# Patient Record
Sex: Female | Born: 1950
Health system: Southern US, Community
[De-identification: ages and names within clinical notes are randomized; demographics above are authoritative.]

## PROBLEM LIST (undated history)

## (undated) DIAGNOSIS — E785 Hyperlipidemia, unspecified: Secondary | ICD-10-CM

## (undated) DIAGNOSIS — I10 Essential (primary) hypertension: Secondary | ICD-10-CM

## (undated) HISTORY — DX: Hyperlipidemia, unspecified: E78.5

## (undated) HISTORY — DX: Essential (primary) hypertension: I10

---

## 2015-07-05 LAB — COLOGUARD: COLOGUARD: NEGATIVE

## 2016-05-31 LAB — LIPID PANEL
CHOLESTEROL: 175 (ref 0–200)
HDL: 84 — AB (ref 35–70)
LDL CALC: 82
TRIGLYCERIDES: 43 (ref 40–160)

## 2016-05-31 LAB — HEPATIC FUNCTION PANEL
ALT: 16 (ref 7–35)
ALT: 16 (ref 7–35)
AST: 17 (ref 13–35)
AST: 17 (ref 13–35)
Alkaline Phosphatase: 61 (ref 25–125)
Alkaline Phosphatase: 61 (ref 25–125)
BILIRUBIN, TOTAL: 0.3
Bilirubin, Total: 0.3

## 2016-05-31 LAB — CBC AND DIFFERENTIAL
HEMATOCRIT: 40 (ref 36–46)
HEMATOCRIT: 40 (ref 36–46)
HEMOGLOBIN: 13.3 (ref 12.0–16.0)
Hemoglobin: 13.8 (ref 12.0–16.0)
PLATELETS: 414 — AB (ref 150–399)
PLATELETS: 414 — AB (ref 150–399)
WBC: 8.9
WBC: 8.9

## 2016-05-31 LAB — BASIC METABOLIC PANEL
BUN: 15 (ref 4–21)
BUN: 15 (ref 4–21)
Creatinine: 0.8 (ref 0.5–1.1)
Creatinine: 0.8 (ref 0.5–1.1)
GLUCOSE: 103
Glucose: 103
Potassium: 3.9 (ref 3.4–5.3)
Potassium: 3.9 (ref 3.4–5.3)
Sodium: 141 (ref 137–147)
Sodium: 141 (ref 137–147)

## 2016-12-14 ENCOUNTER — Ambulatory Visit: Payer: Self-pay | Admitting: Sports Medicine

## 2016-12-15 ENCOUNTER — Ambulatory Visit (INDEPENDENT_AMBULATORY_CARE_PROVIDER_SITE_OTHER): Payer: Self-pay | Admitting: Sports Medicine

## 2016-12-15 ENCOUNTER — Encounter: Payer: Self-pay | Admitting: Sports Medicine

## 2016-12-15 VITALS — BP 120/73 | HR 71 | Ht 59.0 in | Wt 110.0 lb

## 2016-12-15 DIAGNOSIS — M79675 Pain in left toe(s): Secondary | ICD-10-CM

## 2016-12-15 DIAGNOSIS — B070 Plantar wart: Secondary | ICD-10-CM

## 2016-12-15 NOTE — Progress Notes (Signed)
  Subjective: Wendy Wheeler is a 66 y.o. female patient who presents to office for evaluation of Left foot pain secondary to painful wart at the great toe. Patient has noticed this since 6 months ago. Reports history of using gym showers without shower shoes. Patient denies any other pedal complaints.   There are no active problems to display for this patient.   No current outpatient prescriptions on file prior to visit.   No current facility-administered medications on file prior to visit.     Allergies  Allergen Reactions  . Aleve [Naproxen Sodium] Swelling    Objective:  General: Alert and oriented x3 in no acute distress  Dermatology: Keratotic lesion 2 present measuring less than 1 cm at plantar hallux on left with no skin lines transversing the lesion, pain is present with medial lateral pressure to the lesion, capillaries with pin point bleeding noted, no webspace macerations, no ecchymosis bilateral, all nails x 10 are well manicured.  Vascular: Dorsalis Pedis and Posterior Tibial pedal pulses 2/4, Capillary Fill Time 3 seconds, + pedal hair growth bilateral, no edema bilateral lower extremities, Temperature gradient within normal limits.  Neurology: Gross sensation intact via light touch bilateral.  Musculoskeletal: Mild tenderness with palpation at the lesion site on Left, Muscular strength 5/5 in all groups without pain or limitation on range of motion. No lower extremity muscular or boney deformity noted.  Assessment and Plan: Problem List Items Addressed This Visit    None    Visit Diagnoses    Plantar wart of left foot    -  Primary   Great toe pain, left         -Complete examination performed -Discussed treatment options for wart -Parred keratoic warty lesions 2 using a chisel blade; treated the areas with Catharidin covered with bandaid; Advised patient of blistering reaction that will occur from application of medication and once this happens replace bandaid  with neosporin and tape/bandaid -Patient to return to office in 3 weeks or sooner if condition worsens.  Landis Martins, DPM

## 2017-01-17 ENCOUNTER — Ambulatory Visit (INDEPENDENT_AMBULATORY_CARE_PROVIDER_SITE_OTHER): Payer: Self-pay | Admitting: Family Medicine

## 2017-01-17 ENCOUNTER — Encounter: Payer: Self-pay | Admitting: Family Medicine

## 2017-01-17 VITALS — BP 161/98 | HR 87 | Ht 59.0 in | Wt 110.8 lb

## 2017-01-17 DIAGNOSIS — Z87891 Personal history of nicotine dependence: Secondary | ICD-10-CM

## 2017-01-17 DIAGNOSIS — I10 Essential (primary) hypertension: Secondary | ICD-10-CM

## 2017-01-17 DIAGNOSIS — E784 Other hyperlipidemia: Secondary | ICD-10-CM

## 2017-01-17 DIAGNOSIS — E7849 Other hyperlipidemia: Secondary | ICD-10-CM

## 2017-01-17 DIAGNOSIS — E782 Mixed hyperlipidemia: Secondary | ICD-10-CM | POA: Insufficient documentation

## 2017-01-17 DIAGNOSIS — G8929 Other chronic pain: Secondary | ICD-10-CM

## 2017-01-17 DIAGNOSIS — M545 Low back pain: Secondary | ICD-10-CM

## 2017-01-17 DIAGNOSIS — Z62810 Personal history of physical and sexual abuse in childhood: Secondary | ICD-10-CM

## 2017-01-17 NOTE — Patient Instructions (Signed)
In your please bring in your recent lab results that were done in [place] at next office visit.  Please also bring in a log of your blood pressures at home so that we can determine if it's white coat syndrome for sure on top of hypertension or not.  Please come fasting next office visit so we can obtain additional labs as needed.  Please realize, EXERCISE IS MEDICINE!  -  American Heart Association [facility]) guidelines for exercise : If you are in good health, without any medical conditions, you should engage in 150 minutes of moderate intensity aerobic activity per week.  This means you should be huffing and puffing throughout your workout.   Engaging in regular exercise will improve brain function and memory, as well as improve mood, boost immune system and help with weight management.  As well as the other, more well-known effects of exercise such as decreasing blood sugar levels, decreasing blood pressure,  and decreasing bad cholesterol levels/ increasing good cholesterol levels.     -  The AHA strongly endorses consumption of a diet that contains a variety of foods from all the food categories with an emphasis on fruits and vegetables; fat-free and low-fat dairy products; cereal and grain products; legumes and nuts; and fish, poultry, and/or extra lean meats.    Excessive food intake, especially of foods high in saturated and trans fats, sugar, and salt, should be avoided.    Adequate water intake of roughly 1/2 of your weight in pounds, should equal the ounces of water per day you should drink.  So for instance, if you're 200 pounds, that would be 100 ounces of water per day.         Mediterranean Diet  Why follow it? Research shows. . Those who follow the Mediterranean diet have a reduced risk of heart disease  . The diet is associated with a reduced incidence of Parkinson's and Alzheimer's diseases . People following the diet may have longer life expectancies and lower rates of chronic  diseases  . The Dietary Guidelines for Americans recommends the Mediterranean diet as an eating plan to promote health and prevent disease  What Is the Mediterranean Diet?  . Healthy eating plan based on typical foods and recipes of Mediterranean-style cooking . The diet is primarily a plant based diet; these foods should make up a majority of meals   Starches - Plant based foods should make up a majority of meals - They are an important sources of vitamins, minerals, energy, antioxidants, and fiber - Choose whole grains, foods high in fiber and minimally processed items  - Typical grain sources include wheat, oats, barley, corn, brown rice, bulgar, farro, millet, polenta, couscous  - Various types of beans include chickpeas, lentils, fava beans, black beans, white beans   Fruits  Veggies - Large quantities of antioxidant rich fruits & veggies; 6 or more servings  - Vegetables can be eaten raw or lightly drizzled with oil and cooked  - Vegetables common to the traditional Mediterranean Diet include: artichokes, arugula, beets, broccoli, brussel sprouts, cabbage, carrots, celery, collard greens, cucumbers, eggplant, kale, leeks, lemons, lettuce, mushrooms, okra, onions, peas, peppers, potatoes, pumpkin, radishes, rutabaga, shallots, spinach, sweet potatoes, turnips, zucchini - Fruits common to the Mediterranean Diet include: apples, apricots, avocados, cherries, clementines, dates, figs, grapefruits, grapes, melons, nectarines, oranges, peaches, pears, pomegranates, strawberries, tangerines  Fats - Replace butter and margarine with healthy oils, such as olive oil, canola oil, and tahini  - Limit nuts to  no more than a handful a day  - Nuts include walnuts, almonds, pecans, pistachios, pine nuts  - Limit or avoid candied, honey roasted or heavily salted nuts - Olives are central to the Mediterranean diet - can be eaten whole or used in a variety of dishes   Meats Protein - Limiting red meat: no  more than a few times a month - When eating red meat: choose lean cuts and keep the portion to the size of deck of cards - Eggs: approx. 0 to 4 times a week  - Fish and lean poultry: at least 2 a week  - Healthy protein sources include, chicken, [place], lean beef, lamb - Increase intake of seafood such as tuna, salmon, trout, mackerel, shrimp, scallops - Avoid or limit high fat processed meats such as sausage and bacon  Dairy - Include moderate amounts of low fat dairy products  - Focus on healthy dairy such as fat free yogurt, skim milk, low or reduced fat cheese - Limit dairy products higher in fat such as whole or 2% milk, cheese, ice cream  Alcohol - Moderate amounts of red wine is ok  - No more than 5 oz daily for women (all ages) and men older than age 63  - No more than 10 oz of wine daily for men younger than 78  Other - Limit sweets and other desserts  - Use herbs and spices instead of salt to flavor foods  - Herbs and spices common to the traditional Mediterranean Diet include: basil, bay leaves, chives, cloves, cumin, fennel, garlic, lavender, marjoram, mint, oregano, parsley, pepper, rosemary, sage, savory, sumac, tarragon, thyme   It's not just a diet, it's a lifestyle:  . The Mediterranean diet includes lifestyle factors typical of those in the region  . Foods, drinks and meals are best eaten with others and savored . Daily physical activity is important for overall good health . This could be strenuous exercise like running and aerobics . This could also be more leisurely activities such as walking, housework, yard-work, or taking the stairs . Moderation is the key; a balanced and healthy diet accommodates most foods and drinks . Consider portion sizes and frequency of consumption of certain foods   Meal Ideas & Options:  . Breakfast:  o Whole wheat toast or whole wheat English muffins with peanut butter & hard boiled egg o Steel cut oats topped with apples & cinnamon and  skim milk  o Fresh fruit: banana, strawberries, melon, berries, peaches  o Smoothies: strawberries, bananas, greek yogurt, peanut butter o Low fat greek yogurt with blueberries and granola  o Egg white omelet with spinach and mushrooms o Breakfast couscous: whole wheat couscous, apricots, skim milk, cranberries  . Sandwiches:  o Hummus and grilled vegetables (peppers, zucchini, squash) on whole wheat bread   o Grilled chicken on whole wheat pita with lettuce, tomatoes, cucumbers or tzatziki  o Tuna salad on whole wheat bread: tuna salad made with greek yogurt, olives, red peppers, capers, green onions o Garlic rosemary lamb pita: lamb sauted with garlic, rosemary, salt & pepper; add lettuce, cucumber, greek yogurt to pita - flavor with lemon juice and black pepper  . Seafood:  o Mediterranean grilled salmon, seasoned with garlic, basil, parsley, lemon juice and black pepper o Shrimp, lemon, and spinach whole-grain pasta salad made with low fat greek yogurt  o Seared scallops with lemon orzo  o Seared tuna steaks seasoned salt, pepper, coriander topped with tomato mixture of olives,  tomatoes, olive oil, minced garlic, parsley, green onions and cappers  . Meats:  o Herbed greek chicken salad with kalamata olives, cucumber, feta  o Red bell peppers stuffed with spinach, bulgur, lean ground beef (or lentils) & topped with feta   o Kebabs: skewers of chicken, tomatoes, onions, zucchini, squash  o [place] burgers: made with red onions, mint, dill, lemon juice, feta cheese topped with roasted red peppers . Vegetarian o Cucumber salad: cucumbers, artichoke hearts, celery, red onion, feta cheese, tossed in olive oil & lemon juice  o Hummus and whole grain pita points with a greek salad (lettuce, tomato, feta, olives, cucumbers, red onion) o Lentil soup with celery, carrots made with vegetable broth, garlic, salt and pepper  o Tabouli salad: parsley, bulgur, mint, scallions, cucumbers, tomato,  radishes, lemon juice, olive oil, salt and pepper.
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
## 2017-01-19 ENCOUNTER — Ambulatory Visit: Payer: Self-pay | Admitting: Sports Medicine

## 2017-02-23 ENCOUNTER — Ambulatory Visit (INDEPENDENT_AMBULATORY_CARE_PROVIDER_SITE_OTHER): Payer: Medicare HMO | Admitting: Family Medicine

## 2017-02-23 VITALS — BP 160/96 | HR 76 | Ht 59.0 in | Wt 111.4 lb

## 2017-02-23 DIAGNOSIS — E782 Mixed hyperlipidemia: Secondary | ICD-10-CM

## 2017-02-23 DIAGNOSIS — R5383 Other fatigue: Secondary | ICD-10-CM

## 2017-02-23 DIAGNOSIS — Z87891 Personal history of nicotine dependence: Secondary | ICD-10-CM

## 2017-02-23 DIAGNOSIS — I10 Essential (primary) hypertension: Secondary | ICD-10-CM

## 2017-02-23 MED ORDER — ATORVASTATIN CALCIUM 20 MG PO TABS: 20.0000 mg | ORAL_TABLET | Freq: Every day | ORAL | 0 refills | Status: DC

## 2017-02-23 MED ORDER — HYDROCHLOROTHIAZIDE 25 MG PO TABS: 25.0000 mg | ORAL_TABLET | Freq: Every day | ORAL | 0 refills | Status: DC
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
## 2017-02-23 NOTE — Patient Instructions (Addendum)
-   per patient she says she will come in the very start of next week to get fasting blood work since she is not fasting today.  -  Please come in in about 3-4 weeks so we can review your labs and also again review your home blood pressure log.  Try to check your blood pressure every other day if possible at random times.  Write it down and bring in that when a piece of paper with you next office visit.  -Patient prefers to come in and talk to me about the labs in the near future.  We will then schedule her in the far off future for a complete physical which will include all her health maintenance, immunizations etc.      Also reminded patient it would be important for her to come in for Medicare wellness exam as well as a complete physical as these are 2 separate things and we discussed it today.

## 2017-02-27 ENCOUNTER — Other Ambulatory Visit (INDEPENDENT_AMBULATORY_CARE_PROVIDER_SITE_OTHER): Payer: Medicare HMO

## 2017-02-27 DIAGNOSIS — E782 Mixed hyperlipidemia: Secondary | ICD-10-CM

## 2017-02-27 DIAGNOSIS — Z87891 Personal history of nicotine dependence: Secondary | ICD-10-CM

## 2017-02-27 DIAGNOSIS — R5383 Other fatigue: Secondary | ICD-10-CM

## 2017-02-27 DIAGNOSIS — I10 Essential (primary) hypertension: Secondary | ICD-10-CM

## 2017-02-27 DIAGNOSIS — E559 Vitamin D deficiency, unspecified: Secondary | ICD-10-CM | POA: Diagnosis not present

## 2017-02-28 LAB — VITAMIN D 25 HYDROXY (VIT D DEFICIENCY, FRACTURES): VIT D 25 HYDROXY: 47.5 ng/mL (ref 30.0–100.0)

## 2017-02-28 LAB — COMPREHENSIVE METABOLIC PANEL
ALT: 12 IU/L (ref 0–32)
AST: 17 IU/L (ref 0–40)
Albumin/Globulin Ratio: 1.7 (ref 1.2–2.2)
Albumin: 4.5 g/dL (ref 3.6–4.8)
Alkaline Phosphatase: 74 IU/L (ref 39–117)
BUN/Creatinine Ratio: 18 (ref 12–28)
BUN: 12 mg/dL (ref 8–27)
Bilirubin Total: 0.5 mg/dL (ref 0.0–1.2)
CALCIUM: 10 mg/dL (ref 8.7–10.3)
CO2: 29 mmol/L (ref 20–29)
CREATININE: 0.67 mg/dL (ref 0.57–1.00)
Chloride: 96 mmol/L (ref 96–106)
GFR, EST AFRICAN AMERICAN: 107 mL/min/{1.73_m2} (ref >59)
GFR, EST NON AFRICAN AMERICAN: 93 mL/min/{1.73_m2} (ref >59)
GLUCOSE: 90 mg/dL (ref 65–99)
Globulin, Total: 2.6 g/dL (ref 1.5–4.5)
Potassium: 4 mmol/L (ref 3.5–5.2)
Sodium: 141 mmol/L (ref 134–144)
TOTAL PROTEIN: 7.1 g/dL (ref 6.0–8.5)

## 2017-02-28 LAB — HEPATITIS C ANTIBODY: HEP C VIRUS AB: 0.1 {s_co_ratio} (ref 0.0–0.9)

## 2017-02-28 LAB — CBC WITH DIFFERENTIAL/PLATELET
BASOS ABS: 0 10*3/uL (ref 0.0–0.2)
BASOS: 0 %
EOS (ABSOLUTE): 0.3 10*3/uL (ref 0.0–0.4)
Eos: 3 %
HEMOGLOBIN: 13.4 g/dL (ref 11.1–15.9)
Hematocrit: 38.9 % (ref 34.0–46.6)
IMMATURE GRANS (ABS): 0 10*3/uL (ref 0.0–0.1)
Immature Granulocytes: 0 %
LYMPHS: 35 %
Lymphocytes Absolute: 3.4 10*3/uL — ABNORMAL HIGH (ref 0.7–3.1)
MCH: 31 pg (ref 26.6–33.0)
MCHC: 34.4 g/dL (ref 31.5–35.7)
MCV: 90 fL (ref 79–97)
MONOCYTES: 10 %
Monocytes Absolute: 0.9 10*3/uL (ref 0.1–0.9)
NEUTROS PCT: 52 %
Neutrophils Absolute: 4.9 10*3/uL (ref 1.4–7.0)
PLATELETS: 441 10*3/uL — AB (ref 150–379)
RBC: 4.32 x10E6/uL (ref 3.77–5.28)
RDW: 13.1 % (ref 12.3–15.4)
WBC: 9.5 10*3/uL (ref 3.4–10.8)

## 2017-02-28 LAB — LIPID PANEL
CHOLESTEROL TOTAL: 211 mg/dL — AB (ref 100–199)
Chol/HDL Ratio: 2.3 ratio (ref 0.0–4.4)
HDL: 91 mg/dL (ref >39)
LDL Calculated: 112 mg/dL — ABNORMAL HIGH (ref 0–99)
Triglycerides: 39 mg/dL (ref 0–149)
VLDL Cholesterol Cal: 8 mg/dL (ref 5–40)

## 2017-02-28 LAB — HEMOGLOBIN A1C
Est. average glucose Bld gHb Est-mCnc: 114 mg/dL
Hgb A1c MFr Bld: 5.6 % (ref 4.8–5.6)

## 2017-02-28 LAB — T4, FREE: FREE T4: 1.26 ng/dL (ref 0.82–1.77)

## 2017-02-28 LAB — TSH: TSH: 2.22 u[IU]/mL (ref 0.450–4.500)

## 2017-02-28 LAB — HIV ANTIBODY (ROUTINE TESTING W REFLEX): HIV SCREEN 4TH GENERATION: NONREACTIVE

## 2017-03-20 ENCOUNTER — Ambulatory Visit (INDEPENDENT_AMBULATORY_CARE_PROVIDER_SITE_OTHER): Payer: Medicare HMO | Admitting: Family Medicine

## 2017-03-20 ENCOUNTER — Encounter: Payer: Self-pay | Admitting: Family Medicine

## 2017-03-20 ENCOUNTER — Other Ambulatory Visit: Payer: Medicare HMO

## 2017-03-20 VITALS — BP 154/70 | HR 66 | Ht 59.0 in | Wt 113.3 lb

## 2017-03-20 DIAGNOSIS — E7849 Other hyperlipidemia: Secondary | ICD-10-CM

## 2017-03-20 DIAGNOSIS — Z79899 Other long term (current) drug therapy: Secondary | ICD-10-CM

## 2017-03-20 DIAGNOSIS — Z87891 Personal history of nicotine dependence: Secondary | ICD-10-CM

## 2017-03-20 DIAGNOSIS — E7889 Other lipoprotein metabolism disorders: Secondary | ICD-10-CM | POA: Diagnosis not present

## 2017-03-20 DIAGNOSIS — I10 Essential (primary) hypertension: Secondary | ICD-10-CM | POA: Diagnosis not present

## 2017-03-20 DIAGNOSIS — Z23 Encounter for immunization: Secondary | ICD-10-CM

## 2017-03-20 DIAGNOSIS — E782 Mixed hyperlipidemia: Secondary | ICD-10-CM | POA: Diagnosis not present
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
## 2017-03-20 NOTE — Patient Instructions (Addendum)
In 3 months we'll bring you back and recheck fasting lipid profile as well as ALT since she just restarted your cholesterol medicines back about 4 weeks ago.   Then office with visit with me 1 week later to discuss them per year desire.  Low saturated Transfats diet to help with LDL levels.  Exercise to a cheek guidelines discussed with patient again.  - Maintenance of weight but just eating and being healthier is patient's goal.  - Continue to monitor blood pressure at home as it is well controlled and patient has white coat syndrome on top of hypertension which is been established for many, many years.

## 2017-03-21 ENCOUNTER — Telehealth: Payer: Self-pay | Admitting: Family Medicine

## 2017-03-21 NOTE — Telephone Encounter (Signed)
Spoke with patient and states that she if feeling better today.  Advised pt that the flu shot does not cause the flu as it is an inactivated virus.  Advised pt that if she does not continue to improve, please call our office back.  Pt expressed understanding and is agreeable.  Charyl Bigger, CMA

## 2017-03-21 NOTE — Addendum Note (Signed)
Addended by: Lanier Prude D on: 03/21/2017 05:18 PM   Modules accepted: Orders

## 2017-03-21 NOTE — Telephone Encounter (Signed)
Patient called wanting to speak with someone clinical about her flu shot from yesterday. She states that a few hours after she had pain in her  right arm at the injection site. This was followed by fatigue, puking, diarrhea, and flu symptoms (chills and fever). She states she rested the entire evening and took Tylenol for the symptoms. She is currently feeling slightly better but still would like to talk to the clinic staff.

## 2017-03-28 ENCOUNTER — Other Ambulatory Visit: Payer: Self-pay | Admitting: Family Medicine

## 2017-03-28 DIAGNOSIS — E782 Mixed hyperlipidemia: Secondary | ICD-10-CM

## 2017-03-28 DIAGNOSIS — I10 Essential (primary) hypertension: Secondary | ICD-10-CM

## 2017-04-04 ENCOUNTER — Telehealth: Payer: Self-pay | Admitting: Family Medicine

## 2017-04-04 NOTE — Telephone Encounter (Signed)
We already received from [facility] and medication refills have been sent. MPulliam, CMA/RT(R)

## 2017-04-04 NOTE — Telephone Encounter (Signed)
Pt called states [facility] will be sending [place] Rx refill request for two of her medications :  Atorvastin /Lipitor 20 MG tablets  & Hydrochlorothiazide/Hydrodiuril 25 MG tablets.  ------She is out of both ---- --glh

## 2017-04-05 ENCOUNTER — Other Ambulatory Visit: Payer: Self-pay

## 2017-04-05 DIAGNOSIS — E782 Mixed hyperlipidemia: Secondary | ICD-10-CM

## 2017-04-05 DIAGNOSIS — I10 Essential (primary) hypertension: Secondary | ICD-10-CM

## 2017-04-05 MED ORDER — HYDROCHLOROTHIAZIDE 25 MG PO TABS: 25.0000 mg | ORAL_TABLET | Freq: Every day | ORAL | 0 refills | Status: DC

## 2017-04-05 MED ORDER — ATORVASTATIN CALCIUM 20 MG PO TABS: 20.0000 mg | ORAL_TABLET | Freq: Every day | ORAL | 0 refills | Status: DC

## 2017-06-13 ENCOUNTER — Other Ambulatory Visit: Payer: Self-pay | Admitting: Family Medicine

## 2017-06-13 DIAGNOSIS — I10 Essential (primary) hypertension: Secondary | ICD-10-CM

## 2017-06-13 DIAGNOSIS — E782 Mixed hyperlipidemia: Secondary | ICD-10-CM

## 2017-06-25 ENCOUNTER — Other Ambulatory Visit (INDEPENDENT_AMBULATORY_CARE_PROVIDER_SITE_OTHER): Payer: Medicare HMO

## 2017-06-25 DIAGNOSIS — E782 Mixed hyperlipidemia: Secondary | ICD-10-CM | POA: Diagnosis not present

## 2017-06-25 DIAGNOSIS — Z79899 Other long term (current) drug therapy: Secondary | ICD-10-CM

## 2017-06-26 LAB — LIPID PANEL
CHOL/HDL RATIO: 2 ratio (ref 0.0–4.4)
Cholesterol, Total: 178 mg/dL (ref 100–199)
HDL: 89 mg/dL (ref >39)
LDL CALC: 81 mg/dL (ref 0–99)
TRIGLYCERIDES: 42 mg/dL (ref 0–149)
VLDL CHOLESTEROL CAL: 8 mg/dL (ref 5–40)

## 2017-06-26 LAB — ALT: ALT: 17 IU/L (ref 0–32)

## 2017-07-02 ENCOUNTER — Encounter: Payer: Self-pay | Admitting: Family Medicine

## 2017-07-02 ENCOUNTER — Ambulatory Visit (INDEPENDENT_AMBULATORY_CARE_PROVIDER_SITE_OTHER): Payer: Medicare HMO | Admitting: Family Medicine

## 2017-07-02 VITALS — BP 136/90 | HR 89 | Ht 59.0 in | Wt 116.7 lb

## 2017-07-02 DIAGNOSIS — Z1231 Encounter for screening mammogram for malignant neoplasm of breast: Secondary | ICD-10-CM | POA: Diagnosis not present

## 2017-07-02 DIAGNOSIS — E782 Mixed hyperlipidemia: Secondary | ICD-10-CM | POA: Diagnosis not present

## 2017-07-02 DIAGNOSIS — Z803 Family history of malignant neoplasm of breast: Secondary | ICD-10-CM | POA: Insufficient documentation

## 2017-07-02 DIAGNOSIS — Z1239 Encounter for other screening for malignant neoplasm of breast: Secondary | ICD-10-CM

## 2017-07-02 DIAGNOSIS — Z833 Family history of diabetes mellitus: Secondary | ICD-10-CM | POA: Diagnosis not present

## 2017-07-02 DIAGNOSIS — Z87891 Personal history of nicotine dependence: Secondary | ICD-10-CM | POA: Diagnosis not present

## 2017-07-02 DIAGNOSIS — E7889 Other lipoprotein metabolism disorders: Secondary | ICD-10-CM | POA: Diagnosis not present

## 2017-07-02 DIAGNOSIS — E7849 Other hyperlipidemia: Secondary | ICD-10-CM | POA: Diagnosis not present

## 2017-07-02 DIAGNOSIS — I10 Essential (primary) hypertension: Secondary | ICD-10-CM

## 2017-07-02 DIAGNOSIS — Z122 Encounter for screening for malignant neoplasm of respiratory organs: Secondary | ICD-10-CM

## 2017-07-02 MED ORDER — ATORVASTATIN CALCIUM 20 MG PO TABS: 20.0000 mg | ORAL_TABLET | Freq: Every day | ORAL | 1 refills | Status: DC

## 2017-07-02 NOTE — Patient Instructions (Addendum)
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
## 2017-07-16 ENCOUNTER — Ambulatory Visit
Admission: RE | Admit: 2017-07-16 | Discharge: 2017-07-16 | Disposition: A | Discharge status: Home / Self Care | Payer: Medicare HMO | Source: Ambulatory Visit | Attending: Family Medicine | Admitting: Family Medicine

## 2017-07-16 DIAGNOSIS — Z122 Encounter for screening for malignant neoplasm of respiratory organs: Secondary | ICD-10-CM

## 2017-07-16 DIAGNOSIS — Z87891 Personal history of nicotine dependence: Secondary | ICD-10-CM

## 2017-07-18 DIAGNOSIS — Z0100 Encounter for examination of eyes and vision without abnormal findings: Secondary | ICD-10-CM | POA: Diagnosis not present

## 2017-07-18 DIAGNOSIS — H52223 Regular astigmatism, bilateral: Secondary | ICD-10-CM | POA: Diagnosis not present

## 2017-07-25 ENCOUNTER — Encounter (INDEPENDENT_AMBULATORY_CARE_PROVIDER_SITE_OTHER): Payer: Self-pay | Admitting: Ophthalmology

## 2017-07-25 ENCOUNTER — Telehealth: Payer: Self-pay | Admitting: Family Medicine

## 2017-07-25 DIAGNOSIS — I10 Essential (primary) hypertension: Secondary | ICD-10-CM | POA: Diagnosis not present

## 2017-07-25 DIAGNOSIS — H35033 Hypertensive retinopathy, bilateral: Secondary | ICD-10-CM

## 2017-07-25 DIAGNOSIS — H2513 Age-related nuclear cataract, bilateral: Secondary | ICD-10-CM

## 2017-07-25 DIAGNOSIS — D3132 Benign neoplasm of left choroid: Secondary | ICD-10-CM | POA: Diagnosis not present

## 2017-07-25 DIAGNOSIS — H43813 Vitreous degeneration, bilateral: Secondary | ICD-10-CM

## 2017-07-25 NOTE — Telephone Encounter (Signed)
Spoke to patient - see result note. MPulliam, CMA/RT(R)

## 2017-07-25 NOTE — Telephone Encounter (Signed)
Patient returning medical assistant's message to call office. ° °--glh °

## 2017-07-30 ENCOUNTER — Ambulatory Visit (INDEPENDENT_AMBULATORY_CARE_PROVIDER_SITE_OTHER): Payer: Medicare HMO | Admitting: Family Medicine

## 2017-07-30 ENCOUNTER — Encounter: Payer: Self-pay | Admitting: Family Medicine

## 2017-07-30 VITALS — BP 160/82 | HR 90 | Ht 59.0 in | Wt 117.0 lb

## 2017-07-30 DIAGNOSIS — R911 Solitary pulmonary nodule: Secondary | ICD-10-CM | POA: Diagnosis not present

## 2017-07-30 DIAGNOSIS — E782 Mixed hyperlipidemia: Secondary | ICD-10-CM

## 2017-07-30 DIAGNOSIS — I10 Essential (primary) hypertension: Secondary | ICD-10-CM

## 2017-07-30 DIAGNOSIS — I2510 Atherosclerotic heart disease of native coronary artery without angina pectoris: Secondary | ICD-10-CM

## 2017-07-30 DIAGNOSIS — J439 Emphysema, unspecified: Secondary | ICD-10-CM | POA: Diagnosis not present

## 2017-07-30 DIAGNOSIS — J432 Centrilobular emphysema: Secondary | ICD-10-CM

## 2017-07-30 DIAGNOSIS — Z122 Encounter for screening for malignant neoplasm of respiratory organs: Secondary | ICD-10-CM

## 2017-07-30 DIAGNOSIS — Z87891 Personal history of nicotine dependence: Secondary | ICD-10-CM
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
## 2017-07-30 NOTE — Patient Instructions (Signed)

## 2017-08-15 ENCOUNTER — Other Ambulatory Visit: Payer: Self-pay | Admitting: Family Medicine

## 2017-08-15 DIAGNOSIS — I10 Essential (primary) hypertension: Secondary | ICD-10-CM

## 2017-08-16 ENCOUNTER — Ambulatory Visit
Admission: RE | Admit: 2017-08-16 | Discharge: 2017-08-16 | Disposition: A | Discharge status: Home / Self Care | Payer: Medicare HMO | Source: Ambulatory Visit | Attending: Family Medicine | Admitting: Family Medicine

## 2017-08-16 DIAGNOSIS — Z1231 Encounter for screening mammogram for malignant neoplasm of breast: Secondary | ICD-10-CM | POA: Diagnosis not present

## 2017-08-16 DIAGNOSIS — Z1239 Encounter for other screening for malignant neoplasm of breast: Secondary | ICD-10-CM

## 2017-08-16 DIAGNOSIS — Z803 Family history of malignant neoplasm of breast: Secondary | ICD-10-CM

## 2017-08-31 ENCOUNTER — Encounter: Payer: Self-pay | Admitting: Internal Medicine

## 2017-08-31 ENCOUNTER — Encounter (INDEPENDENT_AMBULATORY_CARE_PROVIDER_SITE_OTHER): Payer: Self-pay

## 2017-08-31 ENCOUNTER — Ambulatory Visit: Payer: Medicare HMO | Admitting: Internal Medicine

## 2017-08-31 VITALS — BP 144/70 | HR 78 | Ht 59.0 in | Wt 116.2 lb

## 2017-08-31 DIAGNOSIS — E782 Mixed hyperlipidemia: Secondary | ICD-10-CM

## 2017-08-31 DIAGNOSIS — I10 Essential (primary) hypertension: Secondary | ICD-10-CM | POA: Diagnosis not present

## 2017-08-31 DIAGNOSIS — I2584 Coronary atherosclerosis due to calcified coronary lesion: Secondary | ICD-10-CM

## 2017-08-31 DIAGNOSIS — I2510 Atherosclerotic heart disease of native coronary artery without angina pectoris: Secondary | ICD-10-CM

## 2017-08-31 NOTE — Progress Notes (Signed)
OFFICE CONSULT NOTE  Chief Complaint:  Coronary artery calcification  Primary Care Physician: Mellody Dance, DO  HPI:  Wendy Wheeler is a 67 y.o. female who is being seen today for the evaluation of coronary artery calcification at the request of Mellody Dance, DO.  This is a pleasant 67 year old female with a history of long-standing tobacco abuse, who stopped smoking 2010.  She recently established her care with Dr. Raliegh Scarlet.  She also has a history of hypertension and dyslipidemia.  She underwent a screening CT scan to look for any possible lung malignancy, and was found to have calcification of the LAD.  She reports being asymptomatic, denying any chest pain.  She does get short of breath if she goes up and down the stairs more than 10 times.  She is very physically active.  She has treated hypertension and recently was noted to have dyslipidemia.  Her LDL was 112.  She was placed on atorvastatin 20 mg daily and is tolerating that well.  Her LDL most recently was 81.  Her goal LDL is less than 70.  We discussed diet today.  She eats a somewhat atherogenic diet.  She eats a lot of red meat and is recently switched from fried foods more to baking.  She is tended to use vegetable oil but is moving towards all of oil and canola oil.  She is also worked on reducing other sources of saturated fats in her diet.  PMHx:  Past Medical History:  Diagnosis Date  . Hyperlipidemia   . Hypertension     No past surgical history on file.  FAMHx:  Family History  Problem Relation Age of Onset  . Cancer Mother        breast  . Hyperlipidemia Mother   . Hypertension Mother   . Hypertension Father   . Hyperlipidemia Father   . Depression Father        suicide  . Cancer Sister        bladder  . Heart disease Paternal Grandmother   . Stroke Paternal Grandfather   . Bladder Cancer Sister   . Diabetes Brother   . Cerebral palsy Brother     SOCHx:   reports that  has never smoked. she has  never used smokeless tobacco. She reports that she does not drink alcohol or use drugs.  ALLERGIES:  Allergies  Allergen Reactions  . Aleve [Naproxen Sodium] Swelling    ROS: Pertinent items noted in HPI and remainder of comprehensive ROS otherwise negative.  HOME MEDS: Current Outpatient Medications on File Prior to Visit  Medication Sig Dispense Refill  . Ascorbic Acid (VITAMIN C) 1000 MG tablet Take 1,000 mg by mouth daily.    Marland Kitchen aspirin EC 81 MG tablet Take 81 mg by mouth daily.    Marland Kitchen atorvastatin (LIPITOR) 20 MG tablet Take 1 tablet (20 mg total) by mouth at bedtime. 90 tablet 1  . cholecalciferol (VITAMIN D) 1000 units tablet Take 1,000 Units by mouth daily. Takes 3 times daily- 3,000 QD    . hydrochlorothiazide (HYDRODIURIL) 25 MG tablet TAKE 1 TABLET (25 MG TOTAL) BY MOUTH DAILY. 90 tablet 0  . Menaquinone-7 (VITAMIN K2 PO) Take 1 tablet by mouth daily.    . Multiple Vitamins-Minerals (HAIR SKIN AND NAILS FORMULA PO) Take 1 tablet by mouth daily.    . vitamin B-12 (CYANOCOBALAMIN) 1000 MCG tablet Take 1,000 mcg by mouth daily.     No current facility-administered medications on file prior to visit.  LABS/IMAGING: No results found for this or any previous visit (from the past 48 hour(s)). No results found.  LIPID PANEL:    Component Value Date/Time   CHOL 178 06/25/2017 0831   TRIG 42 06/25/2017 0831   HDL 89 06/25/2017 0831   CHOLHDL 2.0 06/25/2017 0831   LDLCALC 81 06/25/2017 0831    WEIGHTS: Wt Readings from Last 3 Encounters:  08/31/17 116 lb 3.2 oz (52.7 kg)  07/30/17 117 lb (53.1 kg)  07/02/17 116 lb 11.2 oz (52.9 kg)    VITALS: BP (!) 144/70   Pulse 78   Ht 4\' 11"  (1.499 m)   Wt 116 lb 3.2 oz (52.7 kg)   BMI 23.47 kg/m   EXAM: General appearance: alert and no distress Neck: no carotid bruit, no JVD and thyroid not enlarged, symmetric, no tenderness/mass/nodules Lungs: clear to auscultation bilaterally Heart: regular rate and rhythm Abdomen:  soft, non-tender; bowel sounds normal; no masses,  no organomegaly and scaphoid Extremities: extremities normal, atraumatic, no cyanosis or edema Pulses: 2+ and symmetric Skin: Skin color, texture, turgor normal. No rashes or lesions Neurologic: Grossly normal Psych: Pleasant  EKG: Normal sinus rhythm 78, possible left atrial enlargement- personally reviewed  ASSESSMENT: 1. Single-vessel coronary artery calcification of the LAD 2. History of tobacco abuse 3. Hypertension 4. Dyslipidemia  PLAN: 1.   [name] is asymptomatic single-vessel coronary artery calcification which was noted on a screening CT scan.  She has risk factors including hypertension and dyslipidemia.  Her LDL cholesterol is not yet at goal on low-dose atorvastatin.  There is room for dietary improvement and I would recommend significant changes there.  If she is not able to reach goal LDL less than 70 in the next few months, I would recommend increasing her atorvastatin or consider adding ezetimibe.  Blood pressure appears to be well controlled.  Given single-vessel coronary disease and asymptomatic status, would not recommend stress testing at this time.  Aggressive risk factor modification is recommended.  Thank you for the kind referral.  I will plan to see her back in 6 months.  Pixie Casino, MD, [facility], [place] Director of the [facility] Lipid [facility] &  Cardiovascular Risk Reduction Clinic Diplomate of the American Board of Clinical Lipidology Attending Cardiologist  Direct Dial: [phone]  Fax: [phone]  Website:  [email or web address] 08/31/2017, 12:28 PM

## 2017-08-31 NOTE — Patient Instructions (Signed)
Your physician has recommended you make the following change in your medication: START aspirin 81mg  once daily  Your physician wants you to follow-up in: 6 months with Dr. Debara Pickett. You will receive a reminder letter in the mail two months in advance. If you don't receive a letter, please call our office to schedule the follow-up appointment.

## 2017-10-25 ENCOUNTER — Encounter: Payer: Self-pay | Admitting: Family Medicine

## 2017-10-25 ENCOUNTER — Ambulatory Visit (INDEPENDENT_AMBULATORY_CARE_PROVIDER_SITE_OTHER): Payer: Medicare HMO | Admitting: Family Medicine

## 2017-10-25 VITALS — BP 128/81 | HR 83 | Ht 59.0 in | Wt 113.0 lb

## 2017-10-25 DIAGNOSIS — M25511 Pain in right shoulder: Secondary | ICD-10-CM | POA: Diagnosis not present

## 2017-10-25 DIAGNOSIS — S4990XA Unspecified injury of shoulder and upper arm, unspecified arm, initial encounter: Secondary | ICD-10-CM

## 2017-10-25 NOTE — Patient Instructions (Signed)
Melissa please print out information from the sports med advisor on labral pathology of the shoulder.  I marked it in the book for you  -Also please talk to patient about her Cologuard set her up for that as she is having problems with scheduling and having it done.  Danton Clap, if your symptoms do not slowly continue to improve with time, please let us know as the next steps would be physical therapy plus\minus sports med or Ortho referral
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
## 2017-10-30 ENCOUNTER — Other Ambulatory Visit (HOSPITAL_COMMUNITY)
Admission: RE | Admit: 2017-10-30 | Discharge: 2017-10-30 | Disposition: A | Discharge status: Home / Self Care | Payer: Medicare HMO | Source: Ambulatory Visit | Attending: Family Medicine | Admitting: Family Medicine

## 2017-10-30 ENCOUNTER — Encounter: Payer: Self-pay | Admitting: Family Medicine

## 2017-10-30 ENCOUNTER — Ambulatory Visit (INDEPENDENT_AMBULATORY_CARE_PROVIDER_SITE_OTHER): Payer: Medicare HMO | Admitting: Family Medicine

## 2017-10-30 VITALS — BP 170/95 | HR 75 | Ht 59.0 in | Wt 115.2 lb

## 2017-10-30 DIAGNOSIS — Z1211 Encounter for screening for malignant neoplasm of colon: Secondary | ICD-10-CM

## 2017-10-30 DIAGNOSIS — Z87891 Personal history of nicotine dependence: Secondary | ICD-10-CM

## 2017-10-30 DIAGNOSIS — Z124 Encounter for screening for malignant neoplasm of cervix: Secondary | ICD-10-CM

## 2017-10-30 DIAGNOSIS — F430 Acute stress reaction: Secondary | ICD-10-CM

## 2017-10-30 DIAGNOSIS — R32 Unspecified urinary incontinence: Secondary | ICD-10-CM | POA: Diagnosis not present

## 2017-10-30 DIAGNOSIS — I10 Essential (primary) hypertension: Secondary | ICD-10-CM | POA: Diagnosis not present

## 2017-10-30 LAB — POC HEMOCCULT BLD/STL (OFFICE/1-CARD/DIAGNOSTIC): Fecal Occult Blood, POC: NEGATIVE

## 2017-10-30 MED ORDER — FLUOXETINE HCL 10 MG PO TABS: ORAL_TABLET | ORAL | 0 refills | Status: DC

## 2017-10-30 NOTE — Patient Instructions (Addendum)
USE: Ben wa balls or vaginal weights for pelvic floor strengthening. They come with exercises and instructions-please see the printed handout I gave you of an example of ones you can buy off [facility]  Remember you need to follow-up on your pulmonary nodule around the end of July with a CT scan  -You will need Cologuard every 3 years and make sure you are going for mammograms every year.  Have them send [place] those results each time.   Urinary Incontinence Urinary incontinence is the involuntary loss of urine from your bladder. What are the causes? There are many causes of urinary incontinence. They include:  Medicines.  Infections.  Prostatic enlargement, leading to overflow of urine from your bladder.  Surgery.  Neurological diseases.  Emotional factors.  What are the signs or symptoms? Urinary Incontinence can be divided into four types: 1. Urge incontinence. Urge incontinence is the involuntary loss of urine before you have the opportunity to go to the bathroom. There is a sudden urge to void but not enough time to reach a bathroom. 2. Stress incontinence. Stress incontinence is the sudden loss of urine with any activity that forces urine to pass. It is commonly caused by anatomical changes to the pelvis and sphincter areas of your body. 3. Overflow incontinence. Overflow incontinence is the loss of urine from an obstructed opening to your bladder. This results in a backup of urine and a resultant buildup of pressure within the bladder. When the pressure within the bladder exceeds the closing pressure of the sphincter, the urine overflows, which causes incontinence, similar to water overflowing a dam. 4. Total incontinence. Total incontinence is the loss of urine as a result of the inability to store urine within your bladder.  How is this diagnosed? Evaluating the cause of incontinence may require:  A thorough and complete medical and obstetric history.  A complete physical  exam.  Laboratory tests such as a urine culture and sensitivities.  When additional tests are indicated, they can include:  An ultrasound exam.  Kidney and bladder X-rays.  Cystoscopy. This is an exam of the bladder using a narrow scope.  Urodynamic testing to test the nerve function to the bladder and sphincter areas.  How is this treated? Treatment for urinary incontinence depends on the cause:  For urge incontinence caused by a bacterial infection, antibiotics will be prescribed. If the urge incontinence is related to medicines you take, your health care provider may have you change the medicine.  For stress incontinence, surgery to re-establish anatomical support to the bladder or sphincter, or both, will often correct the condition.  For overflow incontinence caused by an enlarged prostate, an operation to open the channel through the enlarged prostate will allow the flow of urine out of the bladder. In women with fibroids, a hysterectomy may be recommended.  For total incontinence, surgery on your urinary sphincter may help. An artificial urinary sphincter (an inflatable cuff placed around the urethra) may be required. In women who have developed a hole-like passage between their bladder and vagina (vesicovaginal fistula), surgery to close the fistula often is required.  Follow these instructions at home:  Normal daily hygiene and the use of pads or adult diapers that are changed regularly will help prevent odors and skin damage.  Avoid caffeine. It can overstimulate your bladder.  Use the bathroom regularly. Try about every 2-3 hours to go to the bathroom, even if you do not feel the need to do so. Take time to empty your  bladder completely. After urinating, wait a minute. Then try to urinate again.  For causes involving nerve dysfunction, keep a log of the medicines you take and a journal of the times you go to the bathroom. Contact a health care provider if:  You  experience worsening of pain instead of improvement in pain after your procedure.  Your incontinence becomes worse instead of better. Get help right away if:  You experience fever or shaking chills.  You are unable to pass your urine.  You have redness spreading into your groin or down into your thighs. This information is not intended to replace advice given to you by your health care provider. Make sure you discuss any questions you have with your health care provider. Document Released: 07/13/2004 Document Revised: 01/14/2016 Document Reviewed: 11/12/2012 Elsevier Interactive Patient Education  [employer].
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
## 2017-10-31 LAB — CYTOLOGY - PAP
Diagnosis: NEGATIVE
HPV: NOT DETECTED

## 2017-12-13 ENCOUNTER — Ambulatory Visit: Payer: Medicare HMO

## 2017-12-24 ENCOUNTER — Ambulatory Visit
Admission: RE | Admit: 2017-12-24 | Discharge: 2017-12-24 | Disposition: A | Discharge status: Home / Self Care | Payer: Medicare HMO | Source: Ambulatory Visit | Attending: Family Medicine | Admitting: Family Medicine

## 2017-12-24 DIAGNOSIS — Z87891 Personal history of nicotine dependence: Secondary | ICD-10-CM

## 2017-12-24 DIAGNOSIS — I700 Atherosclerosis of aorta: Secondary | ICD-10-CM | POA: Diagnosis not present

## 2017-12-24 DIAGNOSIS — Z122 Encounter for screening for malignant neoplasm of respiratory organs: Secondary | ICD-10-CM

## 2017-12-24 DIAGNOSIS — R911 Solitary pulmonary nodule: Secondary | ICD-10-CM

## 2017-12-31 ENCOUNTER — Ambulatory Visit (INDEPENDENT_AMBULATORY_CARE_PROVIDER_SITE_OTHER): Payer: Medicare HMO | Admitting: Family Medicine

## 2017-12-31 ENCOUNTER — Encounter: Payer: Self-pay | Admitting: Family Medicine

## 2017-12-31 VITALS — BP 138/76 | HR 89 | Ht 59.0 in | Wt 116.0 lb

## 2017-12-31 DIAGNOSIS — R5383 Other fatigue: Secondary | ICD-10-CM | POA: Diagnosis not present

## 2017-12-31 DIAGNOSIS — R911 Solitary pulmonary nodule: Secondary | ICD-10-CM | POA: Diagnosis not present

## 2017-12-31 DIAGNOSIS — F430 Acute stress reaction: Secondary | ICD-10-CM | POA: Diagnosis not present

## 2017-12-31 DIAGNOSIS — E782 Mixed hyperlipidemia: Secondary | ICD-10-CM

## 2017-12-31 DIAGNOSIS — I10 Essential (primary) hypertension: Secondary | ICD-10-CM | POA: Diagnosis not present

## 2017-12-31 DIAGNOSIS — Z87891 Personal history of nicotine dependence: Secondary | ICD-10-CM | POA: Diagnosis not present
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
## 2017-12-31 NOTE — Patient Instructions (Signed)

## 2018-01-01 ENCOUNTER — Other Ambulatory Visit: Payer: Self-pay | Admitting: Family Medicine

## 2018-01-01 DIAGNOSIS — I10 Essential (primary) hypertension: Secondary | ICD-10-CM

## 2018-02-15 DIAGNOSIS — R002 Palpitations: Secondary | ICD-10-CM | POA: Diagnosis not present

## 2018-02-19 ENCOUNTER — Telehealth: Payer: Self-pay

## 2018-02-19 NOTE — Telephone Encounter (Signed)
Patient called to notify us that she had went to Urgent Care on 02/15/18 due to elevated BP and low heart rate - per patient if BP lowered heart rate would increase.  Patient went to [facility] in [place] and BP meds were changes to Metoprolol ER 25 mg 1 tab daily.  Patient states that she is feeling fine, denies any dizziness, SOB, chest pains, etc.  Patient is monitoring BP and states that it has came down and is staying in the 120s-130s over 80s.  Patient has an appointment with Cariology on 03/05/2018.  Patient advised to continue to monitor BP and keep a log to take to Cardiology.  Patient advised to go to ER for any red flag symptoms and patient states that the urgent care also alerted her to this also.  Patient expresses understanding and will follow as needed. MPulliam, CMA/RT(R)

## 2018-02-21 ENCOUNTER — Ambulatory Visit (INDEPENDENT_AMBULATORY_CARE_PROVIDER_SITE_OTHER): Payer: Medicare HMO | Admitting: Adult Health

## 2018-02-21 ENCOUNTER — Encounter: Payer: Self-pay | Admitting: Adult Health

## 2018-02-21 VITALS — BP 159/72 | HR 82 | Ht 59.0 in | Wt 110.7 lb

## 2018-02-21 DIAGNOSIS — I10 Essential (primary) hypertension: Secondary | ICD-10-CM

## 2018-02-21 DIAGNOSIS — F430 Acute stress reaction: Secondary | ICD-10-CM

## 2018-02-21 LAB — EKG 12-LEAD

## 2018-02-21 MED ORDER — LORAZEPAM 0.5 MG PO TABS: ORAL_TABLET | ORAL | 0 refills | Status: DC
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
## 2018-02-21 NOTE — Assessment & Plan Note (Signed)
[place] Controlled Substance Database reviewed- no aberrancies noted. Short Rx of Lorazepam 0.5mg - 1/2 to 1 tablet every 12 hrs as needed for anxiety. Do not drive or drink alcohol when taking Lorazepam. Also recommend mediation and guided imagery to help reduce anxiety.

## 2018-02-21 NOTE — Addendum Note (Signed)
Addended by: Fonnie Mu on: 02/21/2018 04:20 PM   Modules accepted: Orders

## 2018-02-21 NOTE — Addendum Note (Signed)
Addended by: Fonnie Mu on: 02/21/2018 04:11 PM   Modules accepted: Orders

## 2018-02-21 NOTE — Patient Instructions (Addendum)
Hypertension Hypertension, commonly called high blood pressure, is when the force of blood pumping through the arteries is too strong. The arteries are the blood vessels that carry blood from the heart throughout the body. Hypertension forces the heart to work harder to pump blood and may cause arteries to become narrow or stiff. Having untreated or uncontrolled hypertension can cause heart attacks, strokes, kidney disease, and other problems. A blood pressure reading consists of a higher number over a lower number. Ideally, your blood pressure should be below 120/80. The first ("top") number is called the systolic pressure. It is a measure of the pressure in your arteries as your heart beats. The second ("bottom") number is called the diastolic pressure. It is a measure of the pressure in your arteries as the heart relaxes. What are the causes? The cause of this condition is not known. What increases the risk? Some risk factors for high blood pressure are under your control. Others are not. Factors you can change  Smoking.  Having type 2 diabetes mellitus, high cholesterol, or both.  Not getting enough exercise or physical activity.  Being overweight.  Having too much fat, sugar, calories, or salt (sodium) in your diet.  Drinking too much alcohol. Factors that are difficult or impossible to change  Having chronic kidney disease.  Having a family history of high blood pressure.  Age. Risk increases with age.  Race. You may be at higher risk if you are African-American.  Gender. Men are at higher risk than women before age 45. After age 65, women are at higher risk than men.  Having obstructive sleep apnea.  Stress. What are the signs or symptoms? Extremely high blood pressure (hypertensive crisis) may cause:  Headache.  Anxiety.  Shortness of breath.  Nosebleed.  Nausea and vomiting.  Severe chest pain.  Jerky movements you cannot control (seizures).  How is this  diagnosed? This condition is diagnosed by measuring your blood pressure while you are seated, with your arm resting on a surface. The cuff of the blood pressure monitor will be placed directly against the skin of your upper arm at the level of your heart. It should be measured at least twice using the same arm. Certain conditions can cause a difference in blood pressure between your right and left arms. Certain factors can cause blood pressure readings to be lower or higher than normal (elevated) for a short period of time:  When your blood pressure is higher when you are in a health care provider's office than when you are at home, this is called white coat hypertension. Most people with this condition do not need medicines.  When your blood pressure is higher at home than when you are in a health care provider's office, this is called masked hypertension. Most people with this condition may need medicines to control blood pressure.  If you have a high blood pressure reading during one visit or you have normal blood pressure with other risk factors:  You may be asked to return on a different day to have your blood pressure checked again.  You may be asked to monitor your blood pressure at home for 1 week or longer.  If you are diagnosed with hypertension, you may have other blood or imaging tests to help your health care provider understand your overall risk for other conditions. How is this treated? This condition is treated by making healthy lifestyle changes, such as eating healthy foods, exercising more, and reducing your alcohol intake. Your   health care provider may prescribe medicine if lifestyle changes are not enough to get your blood pressure under control, and if:  Your systolic blood pressure is above 130.  Your diastolic blood pressure is above 80.  Your personal target blood pressure may vary depending on your medical conditions, your age, and other factors. Follow these  instructions at home: Eating and drinking  Eat a diet that is high in fiber and potassium, and low in sodium, added sugar, and fat. An example eating plan is called the DASH (Dietary Approaches to Stop Hypertension) diet. To eat this way: ? Eat plenty of fresh fruits and vegetables. Try to fill half of your plate at each meal with fruits and vegetables. ? Eat whole grains, such as whole wheat pasta, brown rice, or whole grain bread. Fill about one quarter of your plate with whole grains. ? Eat or drink low-fat dairy products, such as skim milk or low-fat yogurt. ? Avoid fatty cuts of meat, processed or cured meats, and poultry with skin. Fill about one quarter of your plate with lean proteins, such as fish, chicken without skin, beans, eggs, and tofu. ? Avoid premade and processed foods. These tend to be higher in sodium, added sugar, and fat.  Reduce your daily sodium intake. Most people with hypertension should eat less than 1,500 mg of sodium a day.  Limit alcohol intake to no more than 1 drink a day for nonpregnant women and 2 drinks a day for men. One drink equals 12 oz of beer, 5 oz of wine, or 1 oz of hard liquor. Lifestyle  Work with your health care provider to maintain a healthy body weight or to lose weight. Ask what an ideal weight is for you.  Get at least 30 minutes of exercise that causes your heart to beat faster (aerobic exercise) most days of the week. Activities may include walking, swimming, or biking.  Include exercise to strengthen your muscles (resistance exercise), such as pilates or lifting weights, as part of your weekly exercise routine. Try to do these types of exercises for 30 minutes at least 3 days a week.  Do not use any products that contain nicotine or tobacco, such as cigarettes and e-cigarettes. If you need help quitting, ask your health care provider.  Monitor your blood pressure at home as told by your health care provider.  Keep all follow-up visits as  told by your health care provider. This is important. Medicines  Take over-the-counter and prescription medicines only as told by your health care provider. Follow directions carefully. Blood pressure medicines must be taken as prescribed.  Do not skip doses of blood pressure medicine. Doing this puts you at risk for problems and can make the medicine less effective.  Ask your health care provider about side effects or reactions to medicines that you should watch for. Contact a health care provider if:  You think you are having a reaction to a medicine you are taking.  You have headaches that keep coming back (recurring).  You feel dizzy.  You have swelling in your ankles.  You have trouble with your vision. Get help right away if:  You develop a severe headache or confusion.  You have unusual weakness or numbness.  You feel faint.  You have severe pain in your chest or abdomen.  You vomit repeatedly.  You have trouble breathing. Summary  Hypertension is when the force of blood pumping through your arteries is too strong. If this condition is not   controlled, it may put you at risk for serious complications.  Your personal target blood pressure may vary depending on your medical conditions, your age, and other factors. For most people, a normal blood pressure is less than 120/80.  Hypertension is treated with lifestyle changes, medicines, or a combination of both. Lifestyle changes include weight loss, eating a healthy, low-sodium diet, exercising more, and limiting alcohol. This information is not intended to replace advice given to you by your health care provider. Make sure you discuss any questions you have with your health care provider. Document Released: 06/05/2005 Document Revised: 05/03/2016 Document Reviewed: 05/03/2016 Elsevier Interactive Patient Education  2018 [employer].  Hypertension Hypertension, commonly called high blood pressure, is when the force of  blood pumping through the arteries is too strong. The arteries are the blood vessels that carry blood from the heart throughout the body. Hypertension forces the heart to work harder to pump blood and may cause arteries to become narrow or stiff. Having untreated or uncontrolled hypertension can cause heart attacks, strokes, kidney disease, and other problems. A blood pressure reading consists of a higher number over a lower number. Ideally, your blood pressure should be below 120/80. The first ("top") number is called the systolic pressure. It is a measure of the pressure in your arteries as your heart beats. The second ("bottom") number is called the diastolic pressure. It is a measure of the pressure in your arteries as the heart relaxes. What are the causes? The cause of this condition is not known. What increases the risk? Some risk factors for high blood pressure are under your control. Others are not. Factors you can change  Smoking.  Having type 2 diabetes mellitus, high cholesterol, or both.  Not getting enough exercise or physical activity.  Being overweight.  Having too much fat, sugar, calories, or salt (sodium) in your diet.  Drinking too much alcohol. Factors that are difficult or impossible to change  Having chronic kidney disease.  Having a family history of high blood pressure.  Age. Risk increases with age.  Race. You may be at higher risk if you are African-American.  Gender. Men are at higher risk than women before age 56. After age 78, women are at higher risk than men.  Having obstructive sleep apnea.  Stress. What are the signs or symptoms? Extremely high blood pressure (hypertensive crisis) may cause:  Headache.  Anxiety.  Shortness of breath.  Nosebleed.  Nausea and vomiting.  Severe chest pain.  Jerky movements you cannot control (seizures).  How is this diagnosed? This condition is diagnosed by measuring your blood pressure while you are  seated, with your arm resting on a surface. The cuff of the blood pressure monitor will be placed directly against the skin of your upper arm at the level of your heart. It should be measured at least twice using the same arm. Certain conditions can cause a difference in blood pressure between your right and left arms. Certain factors can cause blood pressure readings to be lower or higher than normal (elevated) for a short period of time:  When your blood pressure is higher when you are in a health care provider's office than when you are at home, this is called white coat hypertension. Most people with this condition do not need medicines.  When your blood pressure is higher at home than when you are in a health care provider's office, this is called masked hypertension. Most people with this condition may need medicines  to control blood pressure.  If you have a high blood pressure reading during one visit or you have normal blood pressure with other risk factors:  You may be asked to return on a different day to have your blood pressure checked again.  You may be asked to monitor your blood pressure at home for 1 week or longer.  If you are diagnosed with hypertension, you may have other blood or imaging tests to help your health care provider understand your overall risk for other conditions. How is this treated? This condition is treated by making healthy lifestyle changes, such as eating healthy foods, exercising more, and reducing your alcohol intake. Your health care provider may prescribe medicine if lifestyle changes are not enough to get your blood pressure under control, and if:  Your systolic blood pressure is above 130.  Your diastolic blood pressure is above 80.  Your personal target blood pressure may vary depending on your medical conditions, your age, and other factors. Follow these instructions at home: Eating and drinking  Eat a diet that is high in fiber and potassium,  and low in sodium, added sugar, and fat. An example eating plan is called the DASH (Dietary Approaches to Stop Hypertension) diet. To eat this way: ? Eat plenty of fresh fruits and vegetables. Try to fill half of your plate at each meal with fruits and vegetables. ? Eat whole grains, such as whole wheat pasta, brown rice, or whole grain bread. Fill about one quarter of your plate with whole grains. ? Eat or drink low-fat dairy products, such as skim milk or low-fat yogurt. ? Avoid fatty cuts of meat, processed or cured meats, and poultry with skin. Fill about one quarter of your plate with lean proteins, such as fish, chicken without skin, beans, eggs, and tofu. ? Avoid premade and processed foods. These tend to be higher in sodium, added sugar, and fat.  Reduce your daily sodium intake. Most people with hypertension should eat less than 1,500 mg of sodium a day.  Limit alcohol intake to no more than 1 drink a day for nonpregnant women and 2 drinks a day for men. One drink equals 12 oz of beer, 5 oz of wine, or 1 oz of hard liquor. Lifestyle  Work with your health care provider to maintain a healthy body weight or to lose weight. Ask what an ideal weight is for you.  Get at least 30 minutes of exercise that causes your heart to beat faster (aerobic exercise) most days of the week. Activities may include walking, swimming, or biking.  Include exercise to strengthen your muscles (resistance exercise), such as pilates or lifting weights, as part of your weekly exercise routine. Try to do these types of exercises for 30 minutes at least 3 days a week.  Do not use any products that contain nicotine or tobacco, such as cigarettes and e-cigarettes. If you need help quitting, ask your health care provider.  Monitor your blood pressure at home as told by your health care provider.  Keep all follow-up visits as told by your health care provider. This is important. Medicines  Take over-the-counter and  prescription medicines only as told by your health care provider. Follow directions carefully. Blood pressure medicines must be taken as prescribed.  Do not skip doses of blood pressure medicine. Doing this puts you at risk for problems and can make the medicine less effective.  Ask your health care provider about side effects or reactions to medicines that  you should watch for. Contact a health care provider if:  You think you are having a reaction to a medicine you are taking.  You have headaches that keep coming back (recurring).  You feel dizzy.  You have swelling in your ankles.  You have trouble with your vision. Get help right away if:  You develop a severe headache or confusion.  You have unusual weakness or numbness.  You feel faint.  You have severe pain in your chest or abdomen.  You vomit repeatedly.  You have trouble breathing. Summary  Hypertension is when the force of blood pumping through your arteries is too strong. If this condition is not controlled, it may put you at risk for serious complications.  Your personal target blood pressure may vary depending on your medical conditions, your age, and other factors. For most people, a normal blood pressure is less than 120/80.  Hypertension is treated with lifestyle changes, medicines, or a combination of both. Lifestyle changes include weight loss, eating a healthy, low-sodium diet, exercising more, and limiting alcohol. This information is not intended to replace advice given to you by your health care provider. Make sure you discuss any questions you have with your health care provider. Document Released: 06/05/2005 Document Revised: 05/03/2016 Document Reviewed: 05/03/2016 Elsevier Interactive Patient Education  2018 [employer].  Please take Metoprolol XL 25mg  and Hydrochlorothiazide 25mg  both once daily. Please check you blood pressure and heart rate daily, try not to over-check numbers. Drink plenty of  water and reduce sodium and caffeine intake. Please call Dr. Debara Pickett and share your elevated blood pressure readings with him, ask for a sooner office appt. Short Rx of Lorazepam 0.5mg - 1/2 to 1 tablet every 12 hrs as needed for anxiety. Do not drive or drink alcohol when taking Lorazepam. Also recommend mediation and guided imagery to help reduce anxiety. Follow-up with Dr. Raliegh Scarlet in 2 weeks. Please call clinic with any questions/concerns. HAPPY BIRTHDAY! Enjoy your brunch at [place]!

## 2018-02-21 NOTE — Assessment & Plan Note (Addendum)
BP 191/96 HR 82 Pt placed on L side and rested for 5 mins, BP recheck 159/72, HR 80 She was seen at [place] 02/15/18 EKG - no acute ST changes EKG today- no acute ST changes, similar tracing to 02/15/18  Please take Metoprolol XL 25mg  and Hydrochlorothiazide 25mg  both once daily. Please check you blood pressure and heart rate daily, try not to over-check numbers. Drink plenty of water and reduce sodium and caffeine intake. Please call Dr. Debara Pickett and share your elevated blood pressure readings with him, ask for a sooner office appt. Follow-up with Dr. Raliegh Scarlet in 2 weeks.

## 2018-02-22 ENCOUNTER — Telehealth: Payer: Self-pay | Admitting: Internal Medicine

## 2018-02-22 NOTE — Telephone Encounter (Signed)
Patient called with [facility] recommendations. Advised that she bring to her 9/17 MD appt her home BP cuff and readings. She voiced understanding.

## 2018-02-22 NOTE — Telephone Encounter (Signed)
New Message:   Pt c/o BP issue: STAT if pt c/o blurred vision, one-sided weakness or slurred speech  1. What are your last 5 BP readings? Today 169/93  HR: 88    2. Are you having any other symptoms (ex. Dizziness, headache, blurred vision, passed out)? Dizziness  3. What is your BP issue? Elevated BP

## 2018-02-22 NOTE — Telephone Encounter (Signed)
Returned call to patient of Dr. Debara Pickett who reports issues with her BP & HR  She went to [place] on 8/30 and was started on metoprolol succ 25mg  daily - she didn't realize she was supposed to take this in conjunction with hctz that he was previously on so she stopped this  She went to PCP office yesterday and was advised to take both meto succ & hctz together and she was also prescribed anti-anxiety medication.   She reports her HR is under better control now - stable.   She reports her BP was 169/93 and HR 88 this morning not long after taking meds. She also took her anxiety medication. While on the phone, she rechecked her VS - BP 173/95 HR 97.    Other BP readings provided: 8/30  145/93 HR 106 (rx'ed toprol) 9/2    157/90 HR 80 9/3    166/94 HR 93 9/4    165/96 HR 83 9/5    159/72 HR 82 (started taking BOTH toprol & hctz after this NP visit - epic)  Advised would send to [facility] to review and advise since MD is on vacation

## 2018-02-22 NOTE — Telephone Encounter (Signed)
Have her continue with both medications and continue with once or twice daily home BP checks.  If after 10-14 days she is still getting readings > 130 consistently, she should make an appt with [facility]

## 2018-03-05 ENCOUNTER — Encounter: Payer: Self-pay | Admitting: Internal Medicine

## 2018-03-05 ENCOUNTER — Ambulatory Visit: Payer: Medicare HMO | Admitting: Internal Medicine

## 2018-03-05 VITALS — BP 174/84 | HR 95 | Ht 59.0 in | Wt 113.4 lb

## 2018-03-05 DIAGNOSIS — I10 Essential (primary) hypertension: Secondary | ICD-10-CM

## 2018-03-05 DIAGNOSIS — E782 Mixed hyperlipidemia: Secondary | ICD-10-CM | POA: Diagnosis not present

## 2018-03-05 DIAGNOSIS — I2510 Atherosclerotic heart disease of native coronary artery without angina pectoris: Secondary | ICD-10-CM

## 2018-03-05 DIAGNOSIS — I2584 Coronary atherosclerosis due to calcified coronary lesion: Secondary | ICD-10-CM

## 2018-03-05 MED ORDER — ATORVASTATIN CALCIUM 40 MG PO TABS: 40.0000 mg | ORAL_TABLET | Freq: Every day | ORAL | 3 refills | Status: DC

## 2018-03-05 NOTE — Progress Notes (Signed)
OFFICE CONSULT NOTE  Chief Complaint:  Coronary artery calcification  Primary Care Physician: Mellody Dance, DO  HPI:  Wendy Wheeler is a 67 y.o. female who is being seen today for the evaluation of coronary artery calcification at the request of Mellody Dance, DO.  This is a pleasant 67 year old female with a history of long-standing tobacco abuse, who stopped smoking 2010.  She recently established her care with Dr. Raliegh Scarlet.  She also has a history of hypertension and dyslipidemia.  She underwent a screening CT scan to look for any possible lung malignancy, and was found to have calcification of the LAD.  She reports being asymptomatic, denying any chest pain.  She does get short of breath if she goes up and down the stairs more than 10 times.  She is very physically active.  She has treated hypertension and recently was noted to have dyslipidemia.  Her LDL was 112.  She was placed on atorvastatin 20 mg daily and is tolerating that well.  Her LDL most recently was 81.  Her goal LDL is less than 70.  We discussed diet today.  She eats a somewhat atherogenic diet.  She eats a lot of red meat and is recently switched from fried foods more to baking.  She is tended to use vegetable oil but is moving towards all of oil and canola oil.  She is also worked on reducing other sources of saturated fats in her diet.  03/05/2018  Wendy Wheeler is seen today in follow-up.  Overall she continues to do well.  She denies chest pain or worsening shortness of breath.  After making significant dietary changes and please report her cholesterol profile is improved.  Total cholesterol now 178, HDL 42, triglycerides 89 and LDL of 81.  This is close to goal LDL less than 70.  She remains on low-dose atorvastatin 20 mg daily.  She denies any chest pain or worsening shortness of breath.  Blood pressure was elevated today 174/84 and may need to be followed closely.  She has not done a lot of physical activity, having  concerned about her abnormal calcium score.  I reassured her that she could do exertion and should monitor for any symptoms with exercise.  PMHx:  Past Medical History:  Diagnosis Date  . Hyperlipidemia   . Hypertension     No past surgical history on file.  FAMHx:  Family History  Problem Relation Age of Onset  . Cancer Mother        breast  . Hyperlipidemia Mother   . Hypertension Mother   . Hypertension Father   . Hyperlipidemia Father   . Depression Father        suicide  . Cancer Sister        bladder  . Heart disease Paternal Grandmother   . Stroke Paternal Grandfather   . Bladder Cancer Sister   . Diabetes Brother   . Cerebral palsy Brother     SOCHx:   reports that she has never smoked. She has never used smokeless tobacco. She reports that she does not drink alcohol or use drugs.  ALLERGIES:  Allergies  Allergen Reactions  . Aleve [Naproxen Sodium] Swelling    ROS: Pertinent items noted in HPI and remainder of comprehensive ROS otherwise negative.  HOME MEDS: Current Outpatient Medications on File Prior to Visit  Medication Sig Dispense Refill  . Ascorbic Acid (VITAMIN C) 1000 MG tablet Take 1,000 mg by mouth daily.    Marland Kitchen aspirin  EC 81 MG tablet Take 81 mg by mouth daily.    Marland Kitchen atorvastatin (LIPITOR) 20 MG tablet Take 1 tablet (20 mg total) by mouth at bedtime. 90 tablet 1  . cholecalciferol (VITAMIN D) 1000 units tablet Take 1,000 Units by mouth daily. Takes 3 times daily- 3,000 QD    . hydrochlorothiazide (HYDRODIURIL) 25 MG tablet Take 25 mg by mouth daily.    Marland Kitchen LORazepam (ATIVAN) 0.5 MG tablet 1/2 to 1 tablet by mouth every 12 hrs as needed for anxiety. 15 tablet 0  . Menaquinone-7 (VITAMIN K2 PO) Take 1 tablet by mouth daily.    . metoprolol succinate (TOPROL-XL) 25 MG 24 hr tablet Take 1 tablet by mouth daily.  0  . Multiple Vitamins-Minerals (HAIR SKIN AND NAILS FORMULA PO) Take 1 tablet by mouth daily.    . vitamin B-12 (CYANOCOBALAMIN) 1000 MCG  tablet Take 1,000 mcg by mouth daily.     No current facility-administered medications on file prior to visit.     LABS/IMAGING: No results found for this or any previous visit (from the past 48 hour(s)). No results found.  LIPID PANEL:    Component Value Date/Time   CHOL 178 06/25/2017 0831   TRIG 42 06/25/2017 0831   HDL 89 06/25/2017 0831   CHOLHDL 2.0 06/25/2017 0831   LDLCALC 81 06/25/2017 0831    WEIGHTS: Wt Readings from Last 3 Encounters:  03/05/18 113 lb 6.4 oz (51.4 kg)  02/21/18 110 lb 11.2 oz (50.2 kg)  12/31/17 116 lb (52.6 kg)    VITALS: BP (!) 174/84   Pulse 95   Ht 4\' 11"  (1.499 m)   Wt 113 lb 6.4 oz (51.4 kg)   BMI 22.90 kg/m   EXAM: General appearance: alert and no distress Neck: no carotid bruit, no JVD and thyroid not enlarged, symmetric, no tenderness/mass/nodules Lungs: clear to auscultation bilaterally Heart: regular rate and rhythm Abdomen: soft, non-tender; bowel sounds normal; no masses,  no organomegaly and scaphoid Extremities: extremities normal, atraumatic, no cyanosis or edema Pulses: 2+ and symmetric Skin: Skin color, texture, turgor normal. No rashes or lesions Neurologic: Grossly normal Psych: Pleasant  EKG: Normal sinus rhythm at 95, possible septal infarct pattern-personally reviewed  ASSESSMENT: 1. Single-vessel coronary artery calcification of the LAD 2. History of tobacco abuse 3. Hypertension 4. Dyslipidemia  PLAN: 1.   Wendy Wheeler appears to be asymptomatic with single-vessel coronary artery calcification of the LAD.  Her lipid profile is improved significantly with dietary changes but not reach goal LDL less than 70.  I believe will reach that with a small increase in her atorvastatin from 20 to 40 mg.  I encouraged her to do that today.  Blood pressure is elevated she should monitor that closely and if necessary work with Dr. Raliegh Scarlet or myself to lower blood pressure.  She can start doing some physical activity and if  she were to have any symptoms she should contact us for further recommendations.  Follow-up in 6 months or sooner as necessary.  Pixie Casino, MD, [facility], [place] Director of the [facility] Lipid Disorders &  Cardiovascular [facility] Clinic Diplomate of the American Board of Clinical Lipidology Attending Cardiologist  Direct Dial: [phone]  Fax: [phone]  Website:  [email or web address] [email or web address] Fleming Prill 03/05/2018, 1:58 PM

## 2018-03-05 NOTE — Patient Instructions (Addendum)
Your physician has recommended you make the following change in your medication:  -- INCREASE atorvastatin to 40mg  daily - you can take 2 of your 20mg  tablets until you run out and your new prescription will be for 40mg  tablets.   Your physician wants you to follow-up in: 6 months with Dr. Debara Pickett. You will receive a reminder letter in the mail two months in advance. If you don't receive a letter, please call our office to schedule the follow-up appointment.

## 2018-03-07 ENCOUNTER — Other Ambulatory Visit: Payer: Self-pay | Admitting: Family Medicine

## 2018-03-07 ENCOUNTER — Ambulatory Visit: Payer: Medicare HMO | Admitting: Adult Health

## 2018-03-11 ENCOUNTER — Telehealth: Payer: Self-pay | Admitting: Family Medicine

## 2018-03-11 ENCOUNTER — Telehealth: Payer: Self-pay | Admitting: Emergency Medicine

## 2018-03-11 NOTE — Telephone Encounter (Signed)
Patient called requesting to speak with Dr. Raliegh Scarlet about her metoprolol, if she could receive a call back at [phone].

## 2018-03-11 NOTE — Telephone Encounter (Signed)
   Pt c/o medication issue: 1. Name of Medication: metoprolol succinate (TOPROL-XL) 25 MG 24 hr tablet 2. How are you currently taking this medication (dosage and times per day)? 1 x day  3. Are you having a reaction (difficulty breathing--STAT)?  No  4. What is your medication issue? Patient wants to know if she should continue to take this medication and if so she needs an order called into [facility]

## 2018-03-11 NOTE — Telephone Encounter (Signed)
Spoke with pt. Adv pt that at her last o/v on 9/17 with Dr.Hilty he did not make any changes in her medications. Her Metoprolol and HCTZ Rx are written by her pcp. She will contact her pcp for refills.

## 2018-03-12 NOTE — Telephone Encounter (Signed)
Patient called to notify that she seen cardiology on 03/05/2018 and they recommended that Metoprolol to be managed by Dr. Raliegh Scarlet.  Patient is wanting to know if she needs to continue the medication until follow up with Dr. Raliegh Scarlet on 03/19/2018.  Patient states that BP is running  130-140/80s.  Patient is keeping a log of readings.  Patient states that she is having slight dizziness at times but denies any other symptoms. MPulliam, CMA/RT(R)

## 2018-03-13 NOTE — Telephone Encounter (Signed)
Notified patient to continue medication until follow up per Dr. Raliegh Scarlet, and to bring log of BP readings with her. MPulliam, CMA/RT(R)

## 2018-03-19 ENCOUNTER — Encounter: Payer: Self-pay | Admitting: Family Medicine

## 2018-03-19 ENCOUNTER — Ambulatory Visit (INDEPENDENT_AMBULATORY_CARE_PROVIDER_SITE_OTHER): Payer: Medicare HMO | Admitting: Family Medicine

## 2018-03-19 VITALS — BP 184/81 | HR 69 | Ht 59.0 in | Wt 115.6 lb

## 2018-03-19 DIAGNOSIS — Z87891 Personal history of nicotine dependence: Secondary | ICD-10-CM | POA: Diagnosis not present

## 2018-03-19 DIAGNOSIS — E782 Mixed hyperlipidemia: Secondary | ICD-10-CM

## 2018-03-19 DIAGNOSIS — I10 Essential (primary) hypertension: Secondary | ICD-10-CM

## 2018-03-19 DIAGNOSIS — I2510 Atherosclerotic heart disease of native coronary artery without angina pectoris: Secondary | ICD-10-CM | POA: Diagnosis not present

## 2018-03-19 DIAGNOSIS — F430 Acute stress reaction: Secondary | ICD-10-CM | POA: Diagnosis not present

## 2018-03-19 DIAGNOSIS — I2584 Coronary atherosclerosis due to calcified coronary lesion: Secondary | ICD-10-CM | POA: Diagnosis not present

## 2018-03-19 MED ORDER — METOPROLOL SUCCINATE ER 25 MG PO TB24: 25.0000 mg | ORAL_TABLET | Freq: Every day | ORAL | 0 refills | Status: DC

## 2018-03-19 NOTE — Patient Instructions (Addendum)
If home blood pressure does not remain at 135/85 on average, then I want you to take 2 of your Metoprolol pills for a total dose of 50 mg daily.  Please let us know if you do change this and what your blood pressures are at home that may do think you needed to change it..  Because Dr. Debara Pickett recommended increasing Lipitor from 20-40, it is important we check your ALT or liver enzyme in 6 weeks after that change so, please make a lab only appointment 6 weeks from now since your Medicare wellness with me is on 04/02/2018.  This will be too soon to check the labs.  Guidelines for a Low Cholesterol, Low Saturated Fat Diet   Fats - Limit total intake of fats and oils. - Avoid butter, stick margarine, shortening, lard, palm and coconut oils. - Limit mayonnaise, salad dressings, gravies and sauces, unless they are homemade with low-fat ingredients. - Limit chocolate. - Choose low-fat and nonfat products, such as low-fat mayonnaise, low-fat or non-hydrogenated peanut butter, low-fat or fat-free salad dressings and nonfat gravy. - Use vegetable oil, such as canola or olive oil. - Look for margarine that does not contain trans fatty acids. - Use nuts in moderate amounts. - Read ingredient labels carefully to determine both amount and type of fat present in foods. Limit saturated and trans fats! - Avoid high-fat processed and convenience foods.  Meats and Meat Alternatives - Choose fish, chicken, [place] and lean meats. - Use dried beans, peas, lentils and tofu. - Limit egg yolks to three to four per week. - If you eat red meat, limit to no more than three servings per week and choose loin or round cuts. - Avoid fatty meats, such as bacon, sausage, franks, luncheon meats and ribs. - Avoid all organ meats, including liver.  Dairy - Choose nonfat or low-fat milk, yogurt and cottage cheese. - Most cheeses are high in fat. Choose cheeses made from non-fat milk, such as mozzarella and ricotta cheese. -  Choose light or fat-free cream cheese and sour cream. - Avoid cream and sauces made with cream.  Fruits and Vegetables - Eat a wide variety of fruits and vegetables. - Use lemon juice, vinegar or "mist" olive oil on vegetables. - Avoid adding sauces, fat or oil to vegetables.  Breads, Cereals and Grains - Choose whole-grain breads, cereals, pastas and rice. - Avoid high-fat snack foods, such as granola, cookies, pies, pastries, doughnuts and croissants.  Cooking Tips - Avoid deep fried foods. - Trim visible fat off meats and remove skin from poultry before cooking. - Bake, broil, boil, poach or roast poultry, fish and lean meats. - Drain and discard fat that drains out of meat as you cook it. - Add little or no fat to foods. - Use vegetable oil sprays to grease pans for cooking or baking. - Steam vegetables. - Use herbs or no-oil marinades to flavor foods.
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
## 2018-03-20 DIAGNOSIS — H43812 Vitreous degeneration, left eye: Secondary | ICD-10-CM | POA: Diagnosis not present

## 2018-03-20 DIAGNOSIS — H3562 Retinal hemorrhage, left eye: Secondary | ICD-10-CM | POA: Diagnosis not present

## 2018-03-20 DIAGNOSIS — D3132 Benign neoplasm of left choroid: Secondary | ICD-10-CM | POA: Diagnosis not present

## 2018-04-02 ENCOUNTER — Encounter: Payer: Self-pay | Admitting: Family Medicine

## 2018-04-02 ENCOUNTER — Ambulatory Visit (INDEPENDENT_AMBULATORY_CARE_PROVIDER_SITE_OTHER): Payer: Medicare HMO | Admitting: Family Medicine

## 2018-04-02 VITALS — BP 142/88 | HR 68 | Ht 59.0 in | Wt 120.0 lb

## 2018-04-02 DIAGNOSIS — E2839 Other primary ovarian failure: Secondary | ICD-10-CM | POA: Diagnosis not present

## 2018-04-02 DIAGNOSIS — Z114 Encounter for screening for human immunodeficiency virus [HIV]: Secondary | ICD-10-CM

## 2018-04-02 DIAGNOSIS — Z833 Family history of diabetes mellitus: Secondary | ICD-10-CM

## 2018-04-02 DIAGNOSIS — Z1159 Encounter for screening for other viral diseases: Secondary | ICD-10-CM

## 2018-04-02 DIAGNOSIS — Z0000 Encounter for general adult medical examination without abnormal findings: Secondary | ICD-10-CM

## 2018-04-02 NOTE — Progress Notes (Signed)
Subjective:   Wendy Wheeler is a 67 y.o. female who presents for Medicare Annual (Subsequent) preventive examination.  HPI Patient believes she's had one Medicare Wellness visit here at the clinic in the past. Had last EKG 03/05/2018. Her mammogram is up-to-date. Last pap smear was clear-not high risk so she is done having Pap smears. Denies first-degree family history of cervical cancer or uterine cancer.  Patient's blood pressure is controlled at home.  She has known whitecoat syndrome per  Spoke to representatives two weeks ago about ColoGuard. States that she believes she's been having the materials sent to her old address. Patient plans to call again today to double check about the ColoGuard. She last used the ColoGuard in 2017.  Per patient, last check was normal.  Patient quit smoking within the past 15 years. Notes that she did smoke about a pack per day for over 30 years. Has had two low-dose CT scans to rule out lung cancer-last done 12/24/2017.  Is due yearly.  Patient states that she has no concerns about her memory. She does not often misplace things.    Depression screen [facility] 2/9 04/02/2018 04/02/2018 03/19/2018 02/21/2018 12/31/2017  Decreased Interest 0 0 0 2 0  Down, Depressed, Hopeless 0 0 0 1 0  PHQ - 2 Score 0 0 0 3 0  Altered sleeping 0 0 1 2 0  Tired, decreased energy 0 0 0 2 0  Change in appetite 0 0 1 1 0  Feeling bad or failure about yourself  0 0 0 0 0  Trouble concentrating 0 0 0 1 0  Moving slowly or fidgety/restless 0 0 0 2 0  Suicidal thoughts 0 0 0 0 0  PHQ-9 Score 0 0 2 11 0  Difficult doing work/chores Not difficult at all Not difficult at all - Very difficult Not difficult at all  Some recent data might be hidden   Fall Risk  04/02/2018 12/31/2017 10/30/2017 10/25/2017 07/02/2017  Falls in the past year? No No Yes Yes No  Number falls in past yr: - - 1 1 -  Injury with Fall? - - Yes Yes -  Follow up - - Falls evaluation completed - -   6CIT Screen  04/02/2018  What Year? 0 points  What month? 0 points  What time? 0 points  Count back from 20 0 points  Months in reverse 0 points  Repeat phrase 4 points  Total Score 4    Current Exercise Habits: Structured exercise class, Type of exercise: Other - see comments(Silver Sneakers), Intensity: Moderate    Activities of Daily Living In your present state of health, do you have difficulty performing the following activities?  1- Driving - no 2- Managing money - no 3- Feeding yourself - no 4- Getting from the bed to the chair - no 5- Climbing a flight of stairs - no 6- Preparing food and eating - no 7- Bathing or showering - no 8- Getting dressed - no 9- Getting to the toilet - no 10- Using the toilet - no 11- Moving around from place to place - no  Patient states that she feels safe in her home.    Objective:    Vitals: BP (!) 156/83   Pulse 68   Ht 4\' 11"  (1.499 m)   Wt 120 lb (54.4 kg)   SpO2 98%   BMI 24.24 kg/m   Body mass index is 24.24 kg/m.  Advanced Directives 01/17/2017  Does Patient Have a Medical  Advance Directive? No  Would patient like information on creating a medical advance directive? No - Patient declined    Tobacco Social History   Tobacco Use  Smoking Status Never Smoker  Smokeless Tobacco Never Used     Counseling given: Not Answered   Past Medical History:  Diagnosis Date  . Hyperlipidemia   . Hypertension    History reviewed. No pertinent surgical history. Family History  Problem Relation Age of Onset  . Cancer Mother        breast  . Hyperlipidemia Mother   . Hypertension Mother   . Hypertension Father   . Hyperlipidemia Father   . Depression Father        suicide  . Cancer Sister        bladder  . Heart disease Paternal Grandmother   . Stroke Paternal Grandfather   . Bladder Cancer Sister   . Diabetes Brother   . Cerebral palsy Brother    Social History   Socioeconomic History  . Marital status: Married    Spouse  name: Not on file  . Number of children: Not on file  . Years of education: Not on file  . Highest education level: Not on file  Occupational History  . Not on file  Social Needs  . Financial resource strain: Not on file  . Food insecurity:    Worry: Not on file    Inability: Not on file  . Transportation needs:    Medical: Not on file    Non-medical: Not on file  Tobacco Use  . Smoking status: Never Smoker  . Smokeless tobacco: Never Used  Substance and Sexual Activity  . Alcohol use: No  . Drug use: No  . Sexual activity: Yes    Partners: Male  Lifestyle  . Physical activity:    Days per week: Not on file    Minutes per session: Not on file  . Stress: Not on file  Relationships  . Social connections:    Talks on phone: Not on file    Gets together: Not on file    Attends religious service: Not on file    Active member of club or organization: Not on file    Attends meetings of clubs or organizations: Not on file    Relationship status: Not on file  Other Topics Concern  . Not on file  Social History Narrative  . Not on file    Outpatient Encounter Medications as of 04/02/2018  Medication Sig  . Ascorbic Acid (VITAMIN C) 1000 MG tablet Take 1,000 mg by mouth daily.  Marland Kitchen aspirin EC 81 MG tablet Take 81 mg by mouth daily.  Marland Kitchen atorvastatin (LIPITOR) 40 MG tablet Take 1 tablet (40 mg total) by mouth at bedtime.  . cholecalciferol (VITAMIN D) 1000 units tablet Take 1,000 Units by mouth daily. Takes 3 times daily- 3,000 QD  . hydrochlorothiazide (HYDRODIURIL) 25 MG tablet TAKE 1 TABLET EVERY DAY  . LORazepam (ATIVAN) 0.5 MG tablet 1/2 to 1 tablet by mouth every 12 hrs as needed for anxiety.  . Menaquinone-7 (VITAMIN K2 PO) Take 1 tablet by mouth daily.  . metoprolol succinate (TOPROL-XL) 25 MG 24 hr tablet Take 1 tablet (25 mg total) by mouth daily.  . Multiple Vitamins-Minerals (HAIR SKIN AND NAILS FORMULA PO) Take 1 tablet by mouth daily.  . vitamin B-12 (CYANOCOBALAMIN)  1000 MCG tablet Take 1,000 mcg by mouth daily.   No facility-administered encounter medications on file as of 04/02/2018.  Activities of Daily Living In your present state of health, do you have any difficulty performing the following activities: 04/02/2018  Hearing? N  Vision? N  Difficulty concentrating or making decisions? N  Walking or climbing stairs? N  Dressing or bathing? N  Doing errands, shopping? N  Some recent data might be hidden    Patient Care Team: Mellody Dance, DO as PCP - General (Family Medicine)    Assessment:   This is a routine wellness examination for Wendy Wheeler.  Exercise Activities and Dietary recommendations Current Exercise Habits: Structured exercise class, Type of exercise: Other - see comments(Silver Sneakers), Intensity: Moderate  Goals   None     Fall Risk Fall Risk  04/02/2018 12/31/2017 10/30/2017 10/25/2017 07/02/2017  Falls in the past year? No No Yes Yes No  Number falls in past yr: - - 1 1 -  Injury with Fall? - - Yes Yes -  Follow up - - Falls evaluation completed - -   Is the patient's home free of loose throw rugs in walkways, pet beds, electrical cords, etc?   no      Grab bars in the bathroom? no      Handrails on the stairs?   yes      Adequate lighting?   yes  Timed Get Up and Go performed: passed  Depression Screen PHQ 2/9 Scores 04/02/2018 04/02/2018 03/19/2018 02/21/2018  PHQ - 2 Score 0 0 0 3  PHQ- 9 Score 0 0 2 11     Cognitive Function     6CIT Screen 04/02/2018  What Year? 0 points  What month? 0 points  What time? 0 points  Count back from 20 0 points  Months in reverse 0 points  Repeat phrase 4 points  Total Score 4    Immunization History  Administered Date(s) Administered  . Influenza, High Dose Seasonal PF 03/20/2017    Qualifies for Shingles Vaccine? Patient given information.  Patient declines vaccine at this time.  Screening Tests Health Maintenance  Topic Date Due  . INFLUENZA VACCINE   01/17/2018  . COLONOSCOPY  07/02/2018 (Originally 02/22/2002)  . TETANUS/TDAP  07/02/2018 (Originally 02/23/1970)  . PNA vac Low Risk Adult (1 of 2 - PCV13) 07/02/2018 (Originally 02/22/2017)  . DEXA SCAN  03/20/2019 (Originally 02/22/2017)  . MAMMOGRAM  08/17/2019  . Hepatitis C Screening  Completed    Cancer Screenings: Lung: Low Dose CT Chest recommended if Age 28-80 years, 30 pack-year currently smoking OR have quit w/in 15years. Patient does not qualify. Breast:  Up to date on Mammogram? Yes   Up to date of Bone Density/Dexa? No, patient is not sure of the date of last scan.  Colorectal: Patient has had cologuard within the last 3 years, we are waiting to receive report  Additional Screenings: Hepatitis C Screening: Patient agrees to hepatitis C and HIV.   These will be added onto her labs done in early November      Plan:    [place] - Patient understands that ColoGuard is performed every three years. - Per patient, last check with ColoGuard was normal in 2017.  - Emphasized the importance of continuing with mammogram yearly.  - Advised patient to continue with immunizations as recommended. - Educated patient extensively about immunizations in office today.  - Patient declines shingles vaccine at this time.  Agrees to educate herself about it at home.  Recommended that the patient visit the CDC website for further information, as well as review educational  materials provided today. - Patient declines pneumococcal vaccine at this time, but agrees to educate about this vaccine as well. - Patient agrees to receive influenza vaccine in November.  - Former smoker with history of low-dose CT lung screen. - Continue annual CT screening low-dose every 12 months.  - Advised patient about prudent stool card use today.  - Discussed that she should not use stool cards if experiencing hemorrhoids.  - Continue ambulatory blood pressure monitoring at home. - Discussed prudent blood  pressure monitoring habits.  - Encouraged patient to continue with healthy habits, regular exericse and prudent diet at home. - Discussed the importance of an established exercise routine, and the need for regular cardiovascular activity with patient today.    - She exercises three days a week. - Has been exercising 3d/week for the past 3 weeks.   I have personally reviewed and noted the following in the patient's chart:   . Medical and social history . Use of alcohol, tobacco or illicit drugs  . Current medications and supplements . Functional ability and status . Nutritional status . Physical activity . Advanced directives . List of other physicians . Hospitalizations, surgeries, and ER visits in previous 12 months . Vitals . Screenings to include cognitive, depression, and falls . Referrals and appointments  In addition, I have reviewed and discussed with patient certain preventive protocols, quality metrics, and best practice recommendations. A written personalized care plan for preventive services as well as general preventive health recommendations were provided to patient.     Jerilee Field, [place]  04/02/2018

## 2018-04-02 NOTE — Addendum Note (Signed)
Addended by: Lanier Prude D on: 04/02/2018 09:01 AM   Modules accepted: Orders

## 2018-04-02 NOTE — Patient Instructions (Addendum)
- patient has had low-dose CT scan for rule out lung cancer.  She gets this on a yearly basis.  Last done on 12/24/2017.  -Also patient is due for a DEXA scan.  Per protocol since she is over 26, please place an order for this.  -Also please give patient information sheets on the zoster vaccine as well as pneumococcal.  These are from the [facility] and important for patient's education about these vaccines.   -Also please locate patient's Cologuard results.  She apparently had a in 2017 and it was normal per patient but we do not have records.  Please send letter out to her prior PCP to get these records for our chart    -Wendy Wheeler please continue to check your blood pressures at home as this is extremely helpful since you have whitecoat syndrome and blood pressures are always up here.  Preventive Care for Adults, Female  A healthy lifestyle and preventive care can promote health and wellness. Preventive health guidelines for women include the following key practices.   A routine yearly physical is a good way to check with your health care provider about your health and preventive screening. It is a chance to share any concerns and updates on your health and to receive a thorough exam.   Visit your dentist for a routine exam and preventive care every 6 months. Brush your teeth twice a day and floss once a day. Good oral hygiene prevents tooth decay and gum disease.   The frequency of eye exams is based on your age, health, family medical history, use of contact lenses, and other factors. Follow your health care provider's recommendations for frequency of eye exams.   Eat a healthy diet. Foods like vegetables, fruits, whole grains, low-fat dairy products, and lean protein foods contain the nutrients you need without too many calories. Decrease your intake of foods high in solid fats, added sugars, and salt. Eat the right amount of calories for you.Get information about a proper diet from your  health care provider, if necessary.   Regular physical exercise is one of the most important things you can do for your health. Most adults should get at least 150 minutes of moderate-intensity exercise (any activity that increases your heart rate and causes you to sweat) each week. In addition, most adults need muscle-strengthening exercises on 2 or more days a week.   Maintain a healthy weight. The body mass index (BMI) is a screening tool to identify possible weight problems. It provides an estimate of body fat based on height and weight. Your health care provider can find your BMI, and can help you achieve or maintain a healthy weight.For adults 20 years and older:   - A BMI below 18.5 is considered underweight.   - A BMI of 18.5 to 24.9 is normal.   - A BMI of 25 to 29.9 is considered overweight.   - A BMI of 30 and above is considered obese.   Maintain normal blood lipids and cholesterol levels by exercising and minimizing your intake of trans and saturated fats.  Eat a balanced diet with plenty of fruit and vegetables. Blood tests for lipids and cholesterol should begin at age 18 and be repeated every 5 years minimum.  If your lipid or cholesterol levels are high, you are over 40, or you are at high risk for heart disease, you may need your cholesterol levels checked more frequently.Ongoing high lipid and cholesterol levels should be treated with medicines if diet  and exercise are not working.   If you smoke, find out from your health care provider how to quit. If you do not use tobacco, do not start.   Lung cancer screening is recommended for adults aged 61-80 years who are at high risk for developing lung cancer because of a history of smoking. A yearly low-dose CT scan of the lungs is recommended for people who have at least a 30-pack-year history of smoking and are a current smoker or have quit within the past 15 years. A pack year of smoking is smoking an average of 1 pack of  cigarettes a day for 1 year (for example: 1 pack a day for 30 years or 2 packs a day for 15 years). Yearly screening should continue until the smoker has stopped smoking for at least 15 years. Yearly screening should be stopped for people who develop a health problem that would prevent them from having lung cancer treatment.   If you are pregnant, do not drink alcohol. If you are breastfeeding, be very cautious about drinking alcohol. If you are not pregnant and choose to drink alcohol, do not have more than 1 drink per day. One drink is considered to be 12 ounces (355 mL) of beer, 5 ounces (148 mL) of wine, or 1.5 ounces (44 mL) of liquor.   Avoid use of street drugs. Do not share needles with anyone. Ask for help if you need support or instructions about stopping the use of drugs.   High blood pressure causes heart disease and increases the risk of stroke. Your blood pressure should be checked at least yearly.  Ongoing high blood pressure should be treated with medicines if weight loss and exercise do not work.   If you are 47-22 years old, ask your health care provider if you should take aspirin to prevent strokes.   Diabetes screening involves taking a blood sample to check your fasting blood sugar level. This should be done once every 3 years, after age 83, if you are within normal weight and without risk factors for diabetes. Testing should be considered at a younger age or be carried out more frequently if you are overweight and have at least 1 risk factor for diabetes.   Breast cancer screening is essential preventive care for women. You should practice "breast self-awareness."  This means understanding the normal appearance and feel of your breasts and may include breast self-examination.  Any changes detected, no matter how small, should be reported to a health care provider.  Women in their 31s and 30s should have a clinical breast exam (CBE) by a health care provider as part of a  regular health exam every 1 to 3 years.  After age 45, women should have a CBE every year.  Starting at age 94, women should consider having a mammogram (breast X-ray test) every year.  Women who have a family history of breast cancer should talk to their health care provider about genetic screening.  Women at a high risk of breast cancer should talk to their health care providers about having an MRI and a mammogram every year.   -Breast cancer gene (BRCA)-related cancer risk assessment is recommended for women who have family members with BRCA-related cancers. BRCA-related cancers include breast, ovarian, tubal, and peritoneal cancers. Having family members with these cancers may be associated with an increased risk for harmful changes (mutations) in the breast cancer genes BRCA1 and BRCA2. Results of the assessment will determine the need for genetic  counseling and BRCA1 and BRCA2 testing.   The Pap test is a screening test for cervical cancer. A Pap test can show cell changes on the cervix that might become cervical cancer if left untreated. A Pap test is a procedure in which cells are obtained and examined from the lower end of the uterus (cervix).   - Women should have a Pap test starting at age 75.   - Between ages 42 and 43, Pap tests should be repeated every 2 years.   - Beginning at age 60, you should have a Pap test every 3 years as long as the past 3 Pap tests have been normal.   - Some women have medical problems that increase the chance of getting cervical cancer. Talk to your health care provider about these problems. It is especially important to talk to your health care provider if a new problem develops soon after your last Pap test. In these cases, your health care provider may recommend more frequent screening and Pap tests.   - The above recommendations are the same for women who have or have not gotten the vaccine for human papillomavirus (HPV).   - If you had a hysterectomy for  a problem that was not cancer or a condition that could lead to cancer, then you no longer need Pap tests. Even if you no longer need a Pap test, a regular exam is a good idea to make sure no other problems are starting.   - If you are between ages 59 and 45 years, and you have had normal Pap tests going back 10 years, you no longer need Pap tests. Even if you no longer need a Pap test, a regular exam is a good idea to make sure no other problems are starting.   - If you have had past treatment for cervical cancer or a condition that could lead to cancer, you need Pap tests and screening for cancer for at least 20 years after your treatment.   - If Pap tests have been discontinued, risk factors (such as a new sexual partner) need to be reassessed to determine if screening should be resumed.   - The HPV test is an additional test that may be used for cervical cancer screening. The HPV test looks for the virus that can cause the cell changes on the cervix. The cells collected during the Pap test can be tested for HPV. The HPV test could be used to screen women aged 43 years and older, and should be used in women of any age who have unclear Pap test results. After the age of 33, women should have HPV testing at the same frequency as a Pap test.   Colorectal cancer can be detected and often prevented. Most routine colorectal cancer screening begins at the age of 69 years and continues through age 30 years. However, your health care provider may recommend screening at an earlier age if you have risk factors for colon cancer. On a yearly basis, your health care provider may provide home test kits to check for hidden blood in the stool.  Use of a small camera at the end of a tube, to directly examine the colon (sigmoidoscopy or colonoscopy), can detect the earliest forms of colorectal cancer. Talk to your health care provider about this at age 59, when routine screening begins. Direct exam of the colon should  be repeated every 5 -10 years through age 36 years, unless early forms of pre-cancerous polyps or small  growths are found.   People who are at an increased risk for hepatitis B should be screened for this virus. You are considered at high risk for hepatitis B if:  -You were born in a country where hepatitis B occurs often. Talk with your health care provider about which countries are considered high risk.  - Your parents were born in a high-risk country and you have not received a shot to protect against hepatitis B (hepatitis B vaccine).  - You have HIV or AIDS.  - You use needles to inject street drugs.  - You live with, or have sex with, someone who has Hepatitis B.  - You get hemodialysis treatment.  - You take certain medicines for conditions like cancer, organ transplantation, and autoimmune conditions.   Hepatitis C blood testing is recommended for all people born from [birth year] through 1964 and any individual with known risks for hepatitis C.   Practice safe sex. Use condoms and avoid high-risk sexual practices to reduce the spread of sexually transmitted infections (STIs). STIs include gonorrhea, chlamydia, syphilis, trichomonas, herpes, HPV, and human immunodeficiency virus (HIV). Herpes, HIV, and HPV are viral illnesses that have no cure. They can result in disability, cancer, and death. Sexually active women aged 57 years and younger should be checked for chlamydia. Older women with new or multiple partners should also be tested for chlamydia. Testing for other STIs is recommended if you are sexually active and at increased risk.   Osteoporosis is a disease in which the bones lose minerals and strength with aging. This can result in serious bone fractures or breaks. The risk of osteoporosis can be identified using a bone density scan. Women ages 49 years and over and women at risk for fractures or osteoporosis should discuss screening with their health care providers. Ask your health care  provider whether you should take a calcium supplement or vitamin D to There are also several preventive steps women can take to avoid osteoporosis and resulting fractures or to keep osteoporosis from worsening. -->Recommendations include:  Eat a balanced diet high in fruits, vegetables, calcium, and vitamins.  Get enough calcium. The recommended total intake of is 1,200 mg daily; for best absorption, if taking supplements, divide doses into 250-500 mg doses throughout the day. Of the two types of calcium, calcium carbonate is best absorbed when taken with food but calcium citrate can be taken on an empty stomach.  Get enough vitamin D. [employer] and the [employer] recommend at least 1,000 IU per day for women age 7 and over who are at risk of vitamin D deficiency. Vitamin D deficiency can be caused by inadequate sun exposure (for example, those who live in [place]).  Avoid alcohol and smoking. Heavy alcohol intake (more than 7 drinks per week) increases the risk of falls and hip fracture and women smokers tend to lose bone more rapidly and have lower bone mass than nonsmokers. Stopping smoking is one of the most important changes women can make to improve their health and decrease risk for disease.  Be physically active every day. Weight-bearing exercise (for example, fast walking, hiking, jogging, and weight training) may strengthen bones or slow the rate of bone loss that comes with aging. Balancing and muscle-strengthening exercises can reduce the risk of falling and fracture.  Consider therapeutic medications. Currently, several types of effective drugs are available. Healthcare providers can recommend the type most appropriate for each woman.  Eliminate environmental factors that may contribute to accidents. Falls  cause nearly 90% of all osteoporotic fractures, so reducing this risk is an important bone-health strategy. Measures include ample lighting, removing  obstructions to walking, using nonskid rugs on floors, and placing mats and/or grab bars in showers.  Be aware of medication side effects. Some common medicines make bones weaker. These include a type of steroid drug called glucocorticoids used for arthritis and asthma, some antiseizure drugs, certain sleeping pills, treatments for endometriosis, and some cancer drugs. An overactive thyroid gland or using too much thyroid hormone for an underactive thyroid can also be a problem. If you are taking these medicines, talk to your doctor about what you can do to help protect your bones.reduce the rate of osteoporosis.    Menopause can be associated with physical symptoms and risks. Hormone replacement therapy is available to decrease symptoms and risks. You should talk to your health care provider about whether hormone replacement therapy is right for you.   Use sunscreen. Apply sunscreen liberally and repeatedly throughout the day. You should seek shade when your shadow is shorter than you. Protect yourself by wearing long sleeves, pants, a wide-brimmed hat, and sunglasses year round, whenever you are outdoors.   Once a month, do a whole body skin exam, using a mirror to look at the skin on your back. Tell your health care provider of new moles, moles that have irregular borders, moles that are larger than a pencil eraser, or moles that have changed in shape or color.   -Stay current with required vaccines (immunizations).   Influenza vaccine. All adults should be immunized every year.  Tetanus, diphtheria, and acellular pertussis (Td, Tdap) vaccine. Pregnant women should receive 1 dose of Tdap vaccine during each pregnancy. The dose should be obtained regardless of the length of time since the last dose. Immunization is preferred during the 27th 36th week of gestation. An adult who has not previously received Tdap or who does not know her vaccine status should receive 1 dose of Tdap. This initial  dose should be followed by tetanus and diphtheria toxoids (Td) booster doses every 10 years. Adults with an unknown or incomplete history of completing a 3-dose immunization series with Td-containing vaccines should begin or complete a primary immunization series including a Tdap dose. Adults should receive a Td booster every 10 years.  Varicella vaccine. An adult without evidence of immunity to varicella should receive 2 doses or a second dose if she has previously received 1 dose. Pregnant females who do not have evidence of immunity should receive the first dose after pregnancy. This first dose should be obtained before leaving the health care facility. The second dose should be obtained 4 8 weeks after the first dose.  Human papillomavirus (HPV) vaccine. Females aged 17 26 years who have not received the vaccine previously should obtain the 3-dose series. The vaccine is not recommended for use in pregnant females. However, pregnancy testing is not needed before receiving a dose. If a female is found to be pregnant after receiving a dose, no treatment is needed. In that case, the remaining doses should be delayed until after the pregnancy. Immunization is recommended for any person with an immunocompromised condition through the age of 57 years if she did not get any or all doses earlier. During the 3-dose series, the second dose should be obtained 4 8 weeks after the first dose. The third dose should be obtained 24 weeks after the first dose and 16 weeks after the second dose.  Zoster vaccine. One dose  is recommended for adults aged 36 years or older unless certain conditions are present.  Measles, mumps, and rubella (MMR) vaccine. Adults born before [birth year] generally are considered immune to measles and mumps. Adults born in [birth year] or later should have 1 or more doses of MMR vaccine unless there is a contraindication to the vaccine or there is laboratory evidence of immunity to each of the three diseases. A  routine second dose of MMR vaccine should be obtained at least 28 days after the first dose for students attending postsecondary schools, health care workers, or international travelers. People who received inactivated measles vaccine or an unknown type of measles vaccine during 1963 1967 should receive 2 doses of MMR vaccine. People who received inactivated mumps vaccine or an unknown type of mumps vaccine before 1979 and are at high risk for mumps infection should consider immunization with 2 doses of MMR vaccine. For females of childbearing age, rubella immunity should be determined. If there is no evidence of immunity, females who are not pregnant should be vaccinated. If there is no evidence of immunity, females who are pregnant should delay immunization until after pregnancy. Unvaccinated health care workers born before [birth year] who lack laboratory evidence of measles, mumps, or rubella immunity or laboratory confirmation of disease should consider measles and mumps immunization with 2 doses of MMR vaccine or rubella immunization with 1 dose of MMR vaccine.  Pneumococcal 13-valent conjugate (PCV13) vaccine. When indicated, a person who is uncertain of her immunization history and has no record of immunization should receive the PCV13 vaccine. An adult aged 58 years or older who has certain medical conditions and has not been previously immunized should receive 1 dose of PCV13 vaccine. This PCV13 should be followed with a dose of pneumococcal polysaccharide (PPSV23) vaccine. The PPSV23 vaccine dose should be obtained at least 8 weeks after the dose of PCV13 vaccine. An adult aged 75 years or older who has certain medical conditions and previously received 1 or more doses of PPSV23 vaccine should receive 1 dose of PCV13. The PCV13 vaccine dose should be obtained 1 or more years after the last PPSV23 vaccine dose.  Pneumococcal polysaccharide (PPSV23) vaccine. When PCV13 is also indicated, PCV13 should be  obtained first. All adults aged 33 years and older should be immunized. An adult younger than age 90 years who has certain medical conditions should be immunized. Any person who resides in a nursing home or long-term care facility should be immunized. An adult smoker should be immunized. People with an immunocompromised condition and certain other conditions should receive both PCV13 and PPSV23 vaccines. People with human immunodeficiency virus (HIV) infection should be immunized as soon as possible after diagnosis. Immunization during chemotherapy or radiation therapy should be avoided. Routine use of PPSV23 vaccine is not recommended for American Indians, [place] Natives, or people younger than 65 years unless there are medical conditions that require PPSV23 vaccine. When indicated, people who have unknown immunization and have no record of immunization should receive PPSV23 vaccine. One-time revaccination 5 years after the first dose of PPSV23 is recommended for people aged 83 64 years who have chronic kidney failure, nephrotic syndrome, asplenia, or immunocompromised conditions. People who received 1 2 doses of PPSV23 before age 31 years should receive another dose of PPSV23 vaccine at age 61 years or later if at least 5 years have passed since the previous dose. Doses of PPSV23 are not needed for people immunized with PPSV23 at or after age 53 years.  Meningococcal vaccine.  Adults with asplenia or persistent complement component deficiencies should receive 2 doses of quadrivalent meningococcal conjugate (MenACWY-D) vaccine. The doses should be obtained at least 2 months apart. Microbiologists working with certain meningococcal bacteria, [place] recruits, people at risk during an outbreak, and people who travel to or live in countries with a high rate of meningitis should be immunized. A first-year college student up through age 19 years who is living in a residence hall should receive a dose if she did not  receive a dose on or after her 16th birthday. Adults who have certain high-risk conditions should receive one or more doses of vaccine.  Hepatitis A vaccine. Adults who wish to be protected from this disease, have certain high-risk conditions, work with hepatitis A-infected animals, work in hepatitis A research labs, or travel to or work in countries with a high rate of hepatitis A should be immunized. Adults who were previously unvaccinated and who anticipate close contact with an international adoptee during the first 60 days after arrival in the [place] from a country with a high rate of hepatitis A should be immunized.  Hepatitis B vaccine.  Adults who wish to be protected from this disease, have certain high-risk conditions, may be exposed to blood or other infectious body fluids, are household contacts or sex partners of hepatitis B positive people, are clients or workers in certain care facilities, or travel to or work in countries with a high rate of hepatitis B should be immunized.  Haemophilus influenzae type b (Hib) vaccine. A previously unvaccinated person with asplenia or sickle cell disease or having a scheduled splenectomy should receive 1 dose of Hib vaccine. Regardless of previous immunization, a recipient of a hematopoietic stem cell transplant should receive a 3-dose series 6 12 months after her successful transplant. Hib vaccine is not recommended for adults with HIV infection.  Preventive Services / Frequency Ages 42 to 39years  Blood pressure check.** / Every 1 to 2 years.  Lipid and cholesterol check.** / Every 5 years beginning at age 78.  Clinical breast exam.** / Every 3 years for women in their 89s and 55s.  BRCA-related cancer risk assessment.** / For women who have family members with a BRCA-related cancer (breast, ovarian, tubal, or peritoneal cancers).  Pap test.** / Every 2 years from ages 51 through 25. Every 3 years starting at age 68 through age 48 or 48  with a history of 3 consecutive normal Pap tests.  HPV screening.** / Every 3 years from ages 23 through ages 92 to 84 with a history of 3 consecutive normal Pap tests.  Hepatitis C blood test.** / For any individual with known risks for hepatitis C.  Skin self-exam. / Monthly.  Influenza vaccine. / Every year.  Tetanus, diphtheria, and acellular pertussis (Tdap, Td) vaccine.** / Consult your health care provider. Pregnant women should receive 1 dose of Tdap vaccine during each pregnancy. 1 dose of Td every 10 years.  Varicella vaccine.** / Consult your health care provider. Pregnant females who do not have evidence of immunity should receive the first dose after pregnancy.  HPV vaccine. / 3 doses over 6 months, if 43 and younger. The vaccine is not recommended for use in pregnant females. However, pregnancy testing is not needed before receiving a dose.  Measles, mumps, rubella (MMR) vaccine.** / You need at least 1 dose of MMR if you were born in 1957 or later. You may also need a 2nd dose. For females of childbearing age, rubella immunity  should be determined. If there is no evidence of immunity, females who are not pregnant should be vaccinated. If there is no evidence of immunity, females who are pregnant should delay immunization until after pregnancy.  Pneumococcal 13-valent conjugate (PCV13) vaccine.** / Consult your health care provider.  Pneumococcal polysaccharide (PPSV23) vaccine.** / 1 to 2 doses if you smoke cigarettes or if you have certain conditions.  Meningococcal vaccine.** / 1 dose if you are age 55 to 35 years and a Market researcher living in a residence hall, or have one of several medical conditions, you need to get vaccinated against meningococcal disease. You may also need additional booster doses.  Hepatitis A vaccine.** / Consult your health care provider.  Hepatitis B vaccine.** / Consult your health care provider.  Haemophilus influenzae type b (Hib)  vaccine.** / Consult your health care provider.  Ages 20 to 64years  Blood pressure check.** / Every 1 to 2 years.  Lipid and cholesterol check.** / Every 5 years beginning at age 7 years.  Lung cancer screening. / Every year if you are aged 56 80 years and have a 30-pack-year history of smoking and currently smoke or have quit within the past 15 years. Yearly screening is stopped once you have quit smoking for at least 15 years or develop a health problem that would prevent you from having lung cancer treatment.  Clinical breast exam.** / Every year after age 5 years.  BRCA-related cancer risk assessment.** / For women who have family members with a BRCA-related cancer (breast, ovarian, tubal, or peritoneal cancers).  Mammogram.** / Every year beginning at age 17 years and continuing for as long as you are in good health. Consult with your health care provider.  Pap test.** / Every 3 years starting at age 59 years through age 64 or 44 years with a history of 3 consecutive normal Pap tests.  HPV screening.** / Every 3 years from ages 61 years through ages 82 to 34 years with a history of 3 consecutive normal Pap tests.  Fecal occult blood test (FOBT) of stool. / Every year beginning at age 64 years and continuing until age 40 years. You may not need to do this test if you get a colonoscopy every 10 years.  Flexible sigmoidoscopy or colonoscopy.** / Every 5 years for a flexible sigmoidoscopy or every 10 years for a colonoscopy beginning at age 44 years and continuing until age 3 years.  Hepatitis C blood test.** / For all people born from [birth year] through 1964 and any individual with known risks for hepatitis C.  Skin self-exam. / Monthly.  Influenza vaccine. / Every year.  Tetanus, diphtheria, and acellular pertussis (Tdap/Td) vaccine.** / Consult your health care provider. Pregnant women should receive 1 dose of Tdap vaccine during each pregnancy. 1 dose of Td every 10  years.  Varicella vaccine.** / Consult your health care provider. Pregnant females who do not have evidence of immunity should receive the first dose after pregnancy.  Zoster vaccine.** / 1 dose for adults aged 6 years or older.  Measles, mumps, rubella (MMR) vaccine.** / You need at least 1 dose of MMR if you were born in 1957 or later. You may also need a 2nd dose. For females of childbearing age, rubella immunity should be determined. If there is no evidence of immunity, females who are not pregnant should be vaccinated. If there is no evidence of immunity, females who are pregnant should delay immunization until after pregnancy.  Pneumococcal 13-valent conjugate (  PCV13) vaccine.** / Consult your health care provider.  Pneumococcal polysaccharide (PPSV23) vaccine.** / 1 to 2 doses if you smoke cigarettes or if you have certain conditions.  Meningococcal vaccine.** / Consult your health care provider.  Hepatitis A vaccine.** / Consult your health care provider.  Hepatitis B vaccine.** / Consult your health care provider.  Haemophilus influenzae type b (Hib) vaccine.** / Consult your health care provider.  Ages 18 years and over  Blood pressure check.** / Every 1 to 2 years.  Lipid and cholesterol check.** / Every 5 years beginning at age 64 years.  Lung cancer screening. / Every year if you are aged 70 80 years and have a 30-pack-year history of smoking and currently smoke or have quit within the past 15 years. Yearly screening is stopped once you have quit smoking for at least 15 years or develop a health problem that would prevent you from having lung cancer treatment.  Clinical breast exam.** / Every year after age 55 years.  BRCA-related cancer risk assessment.** / For women who have family members with a BRCA-related cancer (breast, ovarian, tubal, or peritoneal cancers).  Mammogram.** / Every year beginning at age 24 years and continuing for as long as you are in good  health. Consult with your health care provider.  Pap test.** / Every 3 years starting at age 34 years through age 62 or 87 years with 3 consecutive normal Pap tests. Testing can be stopped between 65 and 70 years with 3 consecutive normal Pap tests and no abnormal Pap or HPV tests in the past 10 years.  HPV screening.** / Every 3 years from ages 50 years through ages 6 or 18 years with a history of 3 consecutive normal Pap tests. Testing can be stopped between 65 and 70 years with 3 consecutive normal Pap tests and no abnormal Pap or HPV tests in the past 10 years.  Fecal occult blood test (FOBT) of stool. / Every year beginning at age 70 years and continuing until age 44 years. You may not need to do this test if you get a colonoscopy every 10 years.  Flexible sigmoidoscopy or colonoscopy.** / Every 5 years for a flexible sigmoidoscopy or every 10 years for a colonoscopy beginning at age 18 years and continuing until age 37 years.  Hepatitis C blood test.** / For all people born from [birth year] through 1964 and any individual with known risks for hepatitis C.  Osteoporosis screening.** / A one-time screening for women ages 57 years and over and women at risk for fractures or osteoporosis.  Skin self-exam. / Monthly.  Influenza vaccine. / Every year.  Tetanus, diphtheria, and acellular pertussis (Tdap/Td) vaccine.** / 1 dose of Td every 10 years.  Varicella vaccine.** / Consult your health care provider.  Zoster vaccine.** / 1 dose for adults aged 5 years or older.  Pneumococcal 13-valent conjugate (PCV13) vaccine.** / Consult your health care provider.  Pneumococcal polysaccharide (PPSV23) vaccine.** / 1 dose for all adults aged 70 years and older.  Meningococcal vaccine.** / Consult your health care provider.  Hepatitis A vaccine.** / Consult your health care provider.  Hepatitis B vaccine.** / Consult your health care provider.  Haemophilus influenzae type b (Hib) vaccine.** /  Consult your health care provider. ** Family history and personal history of risk and conditions may change your health care provider's recommendations. Document Released: 08/01/2001 Document Revised: 03/26/2013  [facility] Patient Information 2014 [place], [place].   EXERCISE AND DIET:  We recommended that you start  or continue a regular exercise program for good health. Regular exercise means any activity that makes your heart beat faster and makes you sweat.  We recommend exercising at least 30 minutes per day at least 3 days a week, preferably 5.  We also recommend a diet low in fat and sugar / carbohydrates.  Inactivity, poor dietary choices and obesity can cause diabetes, heart attack, stroke, and kidney damage, among others.     ALCOHOL AND SMOKING:  Women should limit their alcohol intake to no more than 7 drinks/beers/glasses of wine (combined, not each!) per week. Moderation of alcohol intake to this level decreases your risk of breast cancer and liver damage.  ( And of course, no recreational drugs are part of a healthy lifestyle.)  Also, you should not be smoking at all or even being exposed to second hand smoke. Most people know smoking can cause cancer, and various heart and lung diseases, but did you know it also contributes to weakening of your bones?  Aging of your skin?  Yellowing of your teeth and nails?   CALCIUM AND VITAMIN D:  Adequate intake of calcium and Vitamin D are recommended.  The recommendations for exact amounts of these supplements seem to change often, but generally speaking 600 mg of calcium (either carbonate or citrate) and 800 units of Vitamin D per day seems prudent. Certain women may benefit from higher intake of Vitamin D.  If you are among these women, your doctor will have told you during your visit.     PAP SMEARS:  Pap smears, to check for cervical cancer or precancers,  have traditionally been done yearly, although recent scientific advances have shown that  most women can have pap smears less often.  However, every woman still should have a physical exam from her gynecologist or primary care physician every year. It will include a breast check, inspection of the vulva and vagina to check for abnormal growths or skin changes, a visual exam of the cervix, and then an exam to evaluate the size and shape of the uterus and ovaries.  And after 66 years of age, a rectal exam is indicated to check for rectal cancers. We will also provide age appropriate advice regarding health maintenance, like when you should have certain vaccines, screening for sexually transmitted diseases, bone density testing, colonoscopy, mammograms, etc.    MAMMOGRAMS:  All women over 20 years old should have a yearly mammogram. Many facilities now offer a "3D" mammogram, which may cost around $50 extra out of pocket. If possible,  we recommend you accept the option to have the 3D mammogram performed.  It both reduces the number of women who will be called back for extra views which then turn out to be normal, and it is better than the routine mammogram at detecting truly abnormal areas.     COLONOSCOPY:  Colonoscopy to screen for colon cancer is recommended for all women at age 38.  We know, you hate the idea of the prep.  We agree, BUT, having colon cancer and not knowing it is worse!!  Colon cancer so often starts as a polyp that can be seen and removed at colonscopy, which can quite literally save your life!  And if your first colonoscopy is normal and you have no family history of colon cancer, most women don't have to have it again for 10 years.  Once every ten years, you can do something that may end up saving your life, right?  We  will be happy to help you get it scheduled when you are ready.  Be sure to check your insurance coverage so you understand how much it will cost.  It may be covered as a preventative service at no cost, but you should check your particular policy.

## 2018-04-03 DIAGNOSIS — H2513 Age-related nuclear cataract, bilateral: Secondary | ICD-10-CM | POA: Diagnosis not present

## 2018-04-03 DIAGNOSIS — H43812 Vitreous degeneration, left eye: Secondary | ICD-10-CM | POA: Diagnosis not present

## 2018-04-03 DIAGNOSIS — D3132 Benign neoplasm of left choroid: Secondary | ICD-10-CM | POA: Diagnosis not present

## 2018-04-03 DIAGNOSIS — H3562 Retinal hemorrhage, left eye: Secondary | ICD-10-CM | POA: Diagnosis not present

## 2018-04-10 ENCOUNTER — Telehealth: Payer: Self-pay | Admitting: Family Medicine

## 2018-04-10 NOTE — Telephone Encounter (Signed)
Patient request medical assistant call her at [phone]-- pt did not elaborate on reason for call.  --Forwarding request to medical assistant. --Dion Body

## 2018-04-11 ENCOUNTER — Telehealth: Payer: Self-pay | Admitting: Family Medicine

## 2018-04-11 NOTE — Telephone Encounter (Signed)
This is in addition to Gail's previous message. Melissa doesn't need to contact patient, disregard Gail's message from yesterday, Dorothea Ogle answered the question patient had.

## 2018-04-17 ENCOUNTER — Other Ambulatory Visit: Payer: Self-pay | Admitting: Family Medicine

## 2018-04-17 DIAGNOSIS — I10 Essential (primary) hypertension: Secondary | ICD-10-CM

## 2018-05-03 ENCOUNTER — Other Ambulatory Visit: Payer: Medicare HMO

## 2018-05-03 DIAGNOSIS — I2584 Coronary atherosclerosis due to calcified coronary lesion: Secondary | ICD-10-CM

## 2018-05-03 DIAGNOSIS — E782 Mixed hyperlipidemia: Secondary | ICD-10-CM | POA: Diagnosis not present

## 2018-05-03 DIAGNOSIS — Z114 Encounter for screening for human immunodeficiency virus [HIV]: Secondary | ICD-10-CM

## 2018-05-03 DIAGNOSIS — I10 Essential (primary) hypertension: Secondary | ICD-10-CM

## 2018-05-03 DIAGNOSIS — Z87891 Personal history of nicotine dependence: Secondary | ICD-10-CM | POA: Diagnosis not present

## 2018-05-03 DIAGNOSIS — Z1159 Encounter for screening for other viral diseases: Secondary | ICD-10-CM

## 2018-05-03 DIAGNOSIS — Z833 Family history of diabetes mellitus: Secondary | ICD-10-CM | POA: Diagnosis not present

## 2018-05-03 DIAGNOSIS — I2510 Atherosclerotic heart disease of native coronary artery without angina pectoris: Secondary | ICD-10-CM | POA: Diagnosis not present

## 2018-05-04 LAB — LIPID PANEL
CHOL/HDL RATIO: 1.8 ratio (ref 0.0–4.4)
Cholesterol, Total: 180 mg/dL (ref 100–199)
HDL: 101 mg/dL (ref >39)
LDL CALC: 70 mg/dL (ref 0–99)
Triglycerides: 46 mg/dL (ref 0–149)
VLDL CHOLESTEROL CAL: 9 mg/dL (ref 5–40)

## 2018-05-04 LAB — HEPATITIS C ANTIBODY

## 2018-05-04 LAB — CBC WITH DIFFERENTIAL/PLATELET
BASOS ABS: 0.1 10*3/uL (ref 0.0–0.2)
Basos: 1 %
EOS (ABSOLUTE): 0.1 10*3/uL (ref 0.0–0.4)
Eos: 1 %
HEMATOCRIT: 40.6 % (ref 34.0–46.6)
Hemoglobin: 13.8 g/dL (ref 11.1–15.9)
IMMATURE GRANULOCYTES: 0 %
Immature Grans (Abs): 0 10*3/uL (ref 0.0–0.1)
LYMPHS ABS: 3 10*3/uL (ref 0.7–3.1)
Lymphs: 34 %
MCH: 30.4 pg (ref 26.6–33.0)
MCHC: 34 g/dL (ref 31.5–35.7)
MCV: 89 fL (ref 79–97)
Monocytes Absolute: 0.9 10*3/uL (ref 0.1–0.9)
Monocytes: 10 %
NEUTROS PCT: 54 %
Neutrophils Absolute: 4.7 10*3/uL (ref 1.4–7.0)
PLATELETS: 462 10*3/uL — AB (ref 150–450)
RBC: 4.54 x10E6/uL (ref 3.77–5.28)
RDW: 12.2 % — AB (ref 12.3–15.4)
WBC: 8.7 10*3/uL (ref 3.4–10.8)

## 2018-05-04 LAB — COMPREHENSIVE METABOLIC PANEL
ALK PHOS: 72 IU/L (ref 39–117)
ALT: 19 IU/L (ref 0–32)
AST: 19 IU/L (ref 0–40)
Albumin/Globulin Ratio: 1.8 (ref 1.2–2.2)
Albumin: 4.7 g/dL (ref 3.6–4.8)
BUN/Creatinine Ratio: 18 (ref 12–28)
BUN: 14 mg/dL (ref 8–27)
Bilirubin Total: 0.6 mg/dL (ref 0.0–1.2)
CALCIUM: 10.1 mg/dL (ref 8.7–10.3)
CO2: 24 mmol/L (ref 20–29)
Chloride: 97 mmol/L (ref 96–106)
Creatinine, Ser: 0.78 mg/dL (ref 0.57–1.00)
GFR calc Af Amer: 92 mL/min/{1.73_m2} (ref >59)
GFR calc non Af Amer: 79 mL/min/{1.73_m2} (ref >59)
Globulin, Total: 2.6 g/dL (ref 1.5–4.5)
Glucose: 97 mg/dL (ref 65–99)
POTASSIUM: 4 mmol/L (ref 3.5–5.2)
SODIUM: 140 mmol/L (ref 134–144)
Total Protein: 7.3 g/dL (ref 6.0–8.5)

## 2018-05-04 LAB — HIV ANTIBODY (ROUTINE TESTING W REFLEX): HIV Screen 4th Generation wRfx: NONREACTIVE

## 2018-05-04 LAB — HEMOGLOBIN A1C
ESTIMATED AVERAGE GLUCOSE: 114 mg/dL
Hgb A1c MFr Bld: 5.6 % (ref 4.8–5.6)

## 2018-05-08 ENCOUNTER — Ambulatory Visit
Admission: RE | Admit: 2018-05-08 | Discharge: 2018-05-08 | Disposition: A | Discharge status: Home / Self Care | Payer: Medicare HMO | Source: Ambulatory Visit | Attending: Family Medicine | Admitting: Family Medicine

## 2018-05-08 DIAGNOSIS — M810 Age-related osteoporosis without current pathological fracture: Secondary | ICD-10-CM | POA: Diagnosis not present

## 2018-05-08 DIAGNOSIS — Z780 Asymptomatic menopausal state: Secondary | ICD-10-CM | POA: Diagnosis not present

## 2018-05-08 DIAGNOSIS — E2839 Other primary ovarian failure: Secondary | ICD-10-CM

## 2018-05-08 LAB — HM DEXA SCAN

## 2018-05-20 ENCOUNTER — Ambulatory Visit (INDEPENDENT_AMBULATORY_CARE_PROVIDER_SITE_OTHER): Payer: Medicare HMO | Admitting: Family Medicine

## 2018-05-20 ENCOUNTER — Encounter: Payer: Self-pay | Admitting: Family Medicine

## 2018-05-20 ENCOUNTER — Other Ambulatory Visit: Payer: Medicare HMO

## 2018-05-20 VITALS — BP 150/88 | HR 84 | Temp 97.9°F | Ht 59.0 in | Wt 115.0 lb

## 2018-05-20 DIAGNOSIS — Z7189 Other specified counseling: Secondary | ICD-10-CM | POA: Diagnosis not present

## 2018-05-20 DIAGNOSIS — M810 Age-related osteoporosis without current pathological fracture: Secondary | ICD-10-CM

## 2018-05-20 DIAGNOSIS — E2839 Other primary ovarian failure: Secondary | ICD-10-CM

## 2018-05-20 MED ORDER — ALENDRONATE SODIUM 70 MG PO TABS: 70.0000 mg | ORAL_TABLET | ORAL | 11 refills | Status: DC
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
## 2018-05-20 NOTE — Patient Instructions (Addendum)
I put in the order for vitamin D to be obtained in the future when you come in for other blood work.  Please make sure you remind them of this as well.  The order is in the computer however.  -Also, continue to check your blood pressure at home as previously discussed and bring in a log next chronic follow-up office visit.   Alendronate tablets   What is this medicine? ALENDRONATE (a LEN droe nate) slows calcium loss from bones. It helps to make normal healthy bone and to slow bone loss in people with Paget's disease and osteoporosis. It may be used in others at risk for bone loss. This medicine may be used for other purposes; ask your health care provider or pharmacist if you have questions. COMMON BRAND NAME(S): Fosamax What should I tell my health care provider before I take this medicine? They need to know if you have any of these conditions: -dental disease -esophagus, stomach, or intestine problems, like acid reflux or GERD -kidney disease -low blood calcium -low vitamin D -problems sitting or standing 30 minutes -trouble swallowing -an unusual or allergic reaction to alendronate, other medicines, foods, dyes, or preservatives -pregnant or trying to get pregnant -breast-feeding How should I use this medicine? You must take this medicine exactly as directed or you will lower the amount of the medicine you absorb into your body or you may cause yourself harm. Take this medicine by mouth first thing in the morning, after you are up for the day. Do not eat or drink anything before you take your medicine. Swallow the tablet with a full glass (6 to 8 fluid ounces) of plain water. Do not take this medicine with any other drink. Do not chew or crush the tablet. After taking this medicine, do not eat breakfast, drink, or take any medicines or vitamins for at least 30 minutes. Sit or stand up for at least 30 minutes after you take this medicine; do not lie down. Do not take your medicine more  often than directed. Talk to your pediatrician regarding the use of this medicine in children. Special care may be needed. Overdosage: If you think you have taken too much of this medicine contact a poison control center or emergency room at once. NOTE: This medicine is only for you. Do not share this medicine with others. What if I miss a dose? If you miss a dose, do not take it later in the day. Continue your normal schedule starting the next morning. Do not take double or extra doses. What may interact with this medicine? -aluminum hydroxide -antacids -aspirin -calcium supplements -drugs for inflammation like ibuprofen, naproxen, and others -iron supplements -magnesium supplements -vitamins with minerals This list may not describe all possible interactions. Give your health care provider a list of all the medicines, herbs, non-prescription drugs, or dietary supplements you use. Also tell them if you smoke, drink alcohol, or use illegal drugs. Some items may interact with your medicine. What should I watch for while using this medicine? Visit your doctor or health care professional for regular checks ups. It may be some time before you see benefit from this medicine. Do not stop taking your medicine except on your doctor's advice. Your doctor or health care professional may order blood tests and other tests to see how you are doing. You should make sure you get enough calcium and vitamin D while you are taking this medicine, unless your doctor tells you not to. Discuss the foods you  eat and the vitamins you take with your health care professional. Some people who take this medicine have severe bone, joint, and/or muscle pain. This medicine may also increase your risk for a broken thigh bone. Tell your doctor right away if you have pain in your upper leg or groin. Tell your doctor if you have any pain that does not go away or that gets worse. This medicine can make you more sensitive to the sun.  If you get a rash while taking this medicine, sunlight may cause the rash to get worse. Keep out of the sun. If you cannot avoid being in the sun, wear protective clothing and use sunscreen. Do not use sun lamps or tanning beds/booths. What side effects may I notice from receiving this medicine? Side effects that you should report to your doctor or health care professional as soon as possible: -allergic reactions like skin rash, itching or hives, swelling of the face, lips, or tongue -black or tarry stools -bone, muscle or joint pain -changes in vision -chest pain -heartburn or stomach pain -jaw pain, especially after dental work -pain or trouble when swallowing -redness, blistering, peeling or loosening of the skin, including inside the mouth Side effects that usually do not require medical attention (report to your doctor or health care professional if they continue or are bothersome): -changes in taste -diarrhea or constipation -eye pain or itching -headache -nausea or vomiting -stomach gas or fullness This list may not describe all possible side effects. Call your doctor for medical advice about side effects. You may report side effects to FDA at 1-800-FDA-1088. Where should I keep my medicine? Keep out of the reach of children. Store at room temperature of 15 and 30 degrees C (59 and 86 degrees F). Throw away any unused medicine after the expiration date. NOTE: This sheet is a summary. It may not cover all possible information. If you have questions about this medicine, talk to your doctor, pharmacist, or health care provider.  2018 Elsevier/Gold Standard (2010-12-02 08:56:09)

## 2018-06-28 ENCOUNTER — Other Ambulatory Visit: Payer: Self-pay | Admitting: Family Medicine

## 2018-07-17 ENCOUNTER — Telehealth: Payer: Self-pay | Admitting: Family Medicine

## 2018-07-17 ENCOUNTER — Other Ambulatory Visit (INDEPENDENT_AMBULATORY_CARE_PROVIDER_SITE_OTHER): Payer: Medicare HMO

## 2018-07-17 DIAGNOSIS — E2839 Other primary ovarian failure: Secondary | ICD-10-CM | POA: Diagnosis not present

## 2018-07-17 DIAGNOSIS — M810 Age-related osteoporosis without current pathological fracture: Secondary | ICD-10-CM | POA: Diagnosis not present

## 2018-07-17 NOTE — Telephone Encounter (Signed)
Patient called states she forgot to request Rx refill on :    hydrochlorothiazide (HYDRODIURIL) 25 MG tablet [[id number]]   Order Details  Dose, Route, Frequency: As Directed   Dispense Quantity: 90 tablet Refills: 0 Fills remaining: --        Sig: TAKE 1 TABLET EVERY DAY          --Per patient she is almost out and since she uses Mail order pharmacy :   [place], [place] [phone] (Phone) [phone] (Fax)   Needs order to be placed ahead of run out time.  --forwarding request to medical assistant. --Dion Body

## 2018-07-17 NOTE — Telephone Encounter (Signed)
Refill was sent in on 07/01/2018 for 90 day supply, left message to advise her to check with her pharmacy. MPulliam, CMA/RT(R)

## 2018-07-18 LAB — VITAMIN D 25 HYDROXY (VIT D DEFICIENCY, FRACTURES): Vit D, 25-Hydroxy: 44.1 ng/mL (ref 30.0–100.0)

## 2018-07-22 ENCOUNTER — Encounter: Payer: Self-pay | Admitting: Family Medicine

## 2018-07-22 ENCOUNTER — Ambulatory Visit (INDEPENDENT_AMBULATORY_CARE_PROVIDER_SITE_OTHER): Payer: Medicare HMO | Admitting: Family Medicine

## 2018-07-22 VITALS — BP 152/78 | HR 76 | Temp 98.4°F | Ht 59.0 in | Wt 116.6 lb

## 2018-07-22 DIAGNOSIS — F430 Acute stress reaction: Secondary | ICD-10-CM | POA: Diagnosis not present

## 2018-07-22 DIAGNOSIS — M810 Age-related osteoporosis without current pathological fracture: Secondary | ICD-10-CM

## 2018-07-22 DIAGNOSIS — I2510 Atherosclerotic heart disease of native coronary artery without angina pectoris: Secondary | ICD-10-CM | POA: Diagnosis not present

## 2018-07-22 DIAGNOSIS — Z7189 Other specified counseling: Secondary | ICD-10-CM

## 2018-07-22 DIAGNOSIS — E782 Mixed hyperlipidemia: Secondary | ICD-10-CM

## 2018-07-22 DIAGNOSIS — I10 Essential (primary) hypertension: Secondary | ICD-10-CM | POA: Diagnosis not present

## 2018-07-22 NOTE — Patient Instructions (Signed)
Your goal blood pressure should be 140/90 or less on a regular basis, or medications should be started/ modified.    Normal blood pressure is less than 120/80.    Hypertension Hypertension, commonly called high blood pressure, is when the force of blood pumping through the arteries is too strong. The arteries are the blood vessels that carry blood from the heart throughout the body. Hypertension forces the heart to work harder to pump blood and may cause arteries to become narrow or stiff. Having untreated or uncontrolled hypertension can cause heart attacks, strokes, kidney disease, and other problems. A blood pressure reading consists of a higher number over a lower number. Ideally, your blood pressure should be below 120/80. The first ("top") number is called the systolic pressure. It is a measure of the pressure in your arteries as your heart beats. The second ("bottom") number is called the diastolic pressure. It is a measure of the pressure in your arteries as the heart relaxes. What are the causes? The cause of this condition is not known. What increases the risk? Some risk factors for high blood pressure are under your control. Others are not. Factors you can change  Smoking.  Having type 2 diabetes mellitus, high cholesterol, or both.  Not getting enough exercise or physical activity.  Being overweight.  Having too much fat, sugar, calories, or salt (sodium) in your diet.  Drinking too much alcohol. Factors that are difficult or impossible to change  Having chronic kidney disease.  Having a family history of high blood pressure.  Age. Risk increases with age.  Race. You may be at higher risk if you are African-American.  Gender. Men are at higher risk than women before age 4. After age 38, women are at higher risk than men.  Having obstructive sleep apnea.  Stress. What are the signs or symptoms? Extremely high blood pressure (hypertensive crisis) may  cause:  Headache.  Anxiety.  Shortness of breath.  Nosebleed.  Nausea and vomiting.  Severe chest pain.  Jerky movements you cannot control (seizures).  How is this diagnosed? This condition is diagnosed by measuring your blood pressure while you are seated, with your arm resting on a surface. The cuff of the blood pressure monitor will be placed directly against the skin of your upper arm at the level of your heart. It should be measured at least twice using the same arm. Certain conditions can cause a difference in blood pressure between your right and left arms. Certain factors can cause blood pressure readings to be lower or higher than normal (elevated) for a short period of time:  When your blood pressure is higher when you are in a health care provider's office than when you are at home, this is called white coat hypertension. Most people with this condition do not need medicines.  When your blood pressure is higher at home than when you are in a health care provider's office, this is called masked hypertension. Most people with this condition may need medicines to control blood pressure.  If you have a high blood pressure reading during one visit or you have normal blood pressure with other risk factors:  You may be asked to return on a different day to have your blood pressure checked again.  You may be asked to monitor your blood pressure at home for 1 week or longer.  If you are diagnosed with hypertension, you may have other blood or imaging tests to help your health care provider understand  your overall risk for other conditions. How is this treated? This condition is treated by making healthy lifestyle changes, such as eating healthy foods, exercising more, and reducing your alcohol intake. Your health care provider may prescribe medicine if lifestyle changes are not enough to get your blood pressure under control, and if:  Your systolic blood pressure is above  130.  Your diastolic blood pressure is above 80.  Your personal target blood pressure may vary depending on your medical conditions, your age, and other factors. Follow these instructions at home: Eating and drinking  Eat a diet that is high in fiber and potassium, and low in sodium, added sugar, and fat. An example eating plan is called the DASH (Dietary Approaches to Stop Hypertension) diet. To eat this way: ? Eat plenty of fresh fruits and vegetables. Try to fill half of your plate at each meal with fruits and vegetables. ? Eat whole grains, such as whole wheat pasta, brown rice, or whole grain bread. Fill about one quarter of your plate with whole grains. ? Eat or drink low-fat dairy products, such as skim milk or low-fat yogurt. ? Avoid fatty cuts of meat, processed or cured meats, and poultry with skin. Fill about one quarter of your plate with lean proteins, such as fish, chicken without skin, beans, eggs, and tofu. ? Avoid premade and processed foods. These tend to be higher in sodium, added sugar, and fat.  Reduce your daily sodium intake. Most people with hypertension should eat less than 1,500 mg of sodium a day.  Limit alcohol intake to no more than 1 drink a day for nonpregnant women and 2 drinks a day for men. One drink equals 12 oz of beer, 5 oz of wine, or 1 oz of hard liquor. Lifestyle  Work with your health care provider to maintain a healthy body weight or to lose weight. Ask what an ideal weight is for you.  Get at least 30 minutes of exercise that causes your heart to beat faster (aerobic exercise) most days of the week. Activities may include walking, swimming, or biking.  Include exercise to strengthen your muscles (resistance exercise), such as pilates or lifting weights, as part of your weekly exercise routine. Try to do these types of exercises for 30 minutes at least 3 days a week.  Do not use any products that contain nicotine or tobacco, such as cigarettes and  e-cigarettes. If you need help quitting, ask your health care provider.  Monitor your blood pressure at home as told by your health care provider.  Keep all follow-up visits as told by your health care provider. This is important. Medicines  Take over-the-counter and prescription medicines only as told by your health care provider. Follow directions carefully. Blood pressure medicines must be taken as prescribed.  Do not skip doses of blood pressure medicine. Doing this puts you at risk for problems and can make the medicine less effective.  Ask your health care provider about side effects or reactions to medicines that you should watch for. Contact a health care provider if:  You think you are having a reaction to a medicine you are taking.  You have headaches that keep coming back (recurring).  You feel dizzy.  You have swelling in your ankles.  You have trouble with your vision. Get help right away if:  You develop a severe headache or confusion.  You have unusual weakness or numbness.  You feel faint.  You have severe pain in your chest  or abdomen.  You vomit repeatedly.  You have trouble breathing. Summary  Hypertension is when the force of blood pumping through your arteries is too strong. If this condition is not controlled, it may put you at risk for serious complications.  Your personal target blood pressure may vary depending on your medical conditions, your age, and other factors. For most people, a normal blood pressure is less than 120/80.  Hypertension is treated with lifestyle changes, medicines, or a combination of both. Lifestyle changes include weight loss, eating a healthy, low-sodium diet, exercising more, and limiting alcohol. This information is not intended to replace advice given to you by your health care provider. Make sure you discuss any questions you have with your health care provider. Document Released: 06/05/2005 Document Revised: 05/03/2016  Document Reviewed: 05/03/2016 Elsevier Interactive Patient Education  2018 [employer].    How to Take Your Blood Pressure   Blood pressure is a measurement of how strongly your blood is pressing against the walls of your arteries. Arteries are blood vessels that carry blood from your heart throughout your body. Your health care provider takes your blood pressure at each office visit. You can also take your own blood pressure at home with a blood pressure machine. You may need to take your own blood pressure:  To confirm a diagnosis of high blood pressure (hypertension).  To monitor your blood pressure over time.  To make sure your blood pressure medicine is working.  Supplies needed: To take your blood pressure, you will need a blood pressure machine. You can buy a blood pressure machine, or blood pressure monitor, at most drugstores or online. There are several types of home blood pressure monitors. When choosing one, consider the following:  Choose a monitor that has an arm cuff.  Choose a monitor that wraps snugly around your upper arm. You should be able to fit only one finger between your arm and the cuff.  Do not choose a monitor that measures your blood pressure from your wrist or finger.  Your health care provider can suggest a reliable monitor that will meet your needs. How to prepare To get the most accurate reading, avoid the following for 30 minutes before you check your blood pressure:  Drinking caffeine.  Drinking alcohol.  Eating.  Smoking.  Exercising.  Five minutes before you check your blood pressure:  Empty your bladder.  Sit quietly without talking in a dining chair, rather than in a soft couch or armchair.  How to take your blood pressure To check your blood pressure, follow the instructions in the manual that came with your blood pressure monitor. If you have a digital blood pressure monitor, the instructions may be as follows: 1. Sit up  straight. 2. Place your feet on the floor. Do not cross your ankles or legs. 3. Rest your left arm at the level of your heart on a table or desk or on the arm of a chair. 4. Pull up your shirt sleeve. 5. Wrap the blood pressure cuff around the upper part of your left arm, 1 inch (2.5 cm) above your elbow. It is best to wrap the cuff around bare skin. 6. Fit the cuff snugly around your arm. You should be able to place only one finger between the cuff and your arm. 7. Position the cord inside the groove of your elbow. 8. Press the power button. 9. Sit quietly while the cuff inflates and deflates. 10. Read the digital reading on the monitor  screen and write it down (record it). 11. Wait 2-3 minutes, then repeat the steps, starting at step 1.  What does my blood pressure reading mean? A blood pressure reading consists of a higher number over a lower number. Ideally, your blood pressure should be below 120/80. The first ("top") number is called the systolic pressure. It is a measure of the pressure in your arteries as your heart beats. The second ("bottom") number is called the diastolic pressure. It is a measure of the pressure in your arteries as the heart relaxes. Blood pressure is classified into four stages. The following are the stages for adults who do not have a short-term serious illness or a chronic condition. Systolic pressure and diastolic pressure are measured in a unit called mm Hg. Normal  Systolic pressure: below 629.  Diastolic pressure: below 80. Elevated  Systolic pressure: 528-413.  Diastolic pressure: below 80. Hypertension stage 1  Systolic pressure: 244-010.  Diastolic pressure: 27-25. Hypertension stage 2  Systolic pressure: 366 or above.  Diastolic pressure: 90 or above. You can have prehypertension or hypertension even if only the systolic or only the diastolic number in your reading is higher than normal. Follow these instructions at home:  Check your blood  pressure as often as recommended by your health care provider.  Take your monitor to the next appointment with your health care provider to make sure: ? That you are using it correctly. ? That it provides accurate readings.  Be sure you understand what your goal blood pressure numbers are.  Tell your health care provider if you are having any side effects from blood pressure medicine. Contact a health care provider if:  Your blood pressure is consistently high. Get help right away if:  Your systolic blood pressure is higher than 180.  Your diastolic blood pressure is higher than 110. This information is not intended to replace advice given to you by your health care provider. Make sure you discuss any questions you have with your health care provider. Document Released: 11/12/2015 Document Revised: 01/25/2016 Document Reviewed: 11/12/2015 Elsevier Interactive Patient Education  2018 [place] for a Low Cholesterol, Low Saturated Fat Diet   Fats - Limit total intake of fats and oils. - Avoid butter, stick margarine, shortening, lard, palm and coconut oils. - Limit mayonnaise, salad dressings, gravies and sauces, unless they are homemade with low-fat ingredients. - Limit chocolate. - Choose low-fat and nonfat products, such as low-fat mayonnaise, low-fat or non-hydrogenated peanut butter, low-fat or fat-free salad dressings and nonfat gravy. - Use vegetable oil, such as canola or olive oil. - Look for margarine that does not contain trans fatty acids. - Use nuts in moderate amounts. - Read ingredient labels carefully to determine both amount and type of fat present in foods. Limit saturated and trans fats! - Avoid high-fat processed and convenience foods.  Meats and Meat Alternatives - Choose fish, chicken, [place] and lean meats. - Use dried beans, peas, lentils and tofu. - Limit egg yolks to three to four per week. - If you eat red meat, limit to no more  than three servings per week and choose loin or round cuts. - Avoid fatty meats, such as bacon, sausage, franks, luncheon meats and ribs. - Avoid all organ meats, including liver.  Dairy - Choose nonfat or low-fat milk, yogurt and cottage cheese. - Most cheeses are high in fat. Choose cheeses made from non-fat milk, such as mozzarella and ricotta cheese. - Choose light  or fat-free cream cheese and sour cream. - Avoid cream and sauces made with cream.  Fruits and Vegetables - Eat a wide variety of fruits and vegetables. - Use lemon juice, vinegar or "mist" olive oil on vegetables. - Avoid adding sauces, fat or oil to vegetables.  Breads, Cereals and Grains - Choose whole-grain breads, cereals, pastas and rice. - Avoid high-fat snack foods, such as granola, cookies, pies, pastries, doughnuts and croissants.  Cooking Tips - Avoid deep fried foods. - Trim visible fat off meats and remove skin from poultry before cooking. - Bake, broil, boil, poach or roast poultry, fish and lean meats. - Drain and discard fat that drains out of meat as you cook it. - Add little or no fat to foods. - Use vegetable oil sprays to grease pans for cooking or baking. - Steam vegetables. - Use herbs or no-oil marinades to flavor foods.
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
## 2018-07-29 ENCOUNTER — Other Ambulatory Visit: Payer: Self-pay | Admitting: Family Medicine

## 2018-07-29 DIAGNOSIS — Z1231 Encounter for screening mammogram for malignant neoplasm of breast: Secondary | ICD-10-CM

## 2018-08-21 ENCOUNTER — Ambulatory Visit
Admission: RE | Admit: 2018-08-21 | Discharge: 2018-08-21 | Disposition: A | Discharge status: Home / Self Care | Payer: Medicare HMO | Source: Ambulatory Visit | Attending: Family Medicine | Admitting: Family Medicine

## 2018-08-21 DIAGNOSIS — Z1231 Encounter for screening mammogram for malignant neoplasm of breast: Secondary | ICD-10-CM

## 2018-08-22 ENCOUNTER — Other Ambulatory Visit: Payer: Self-pay | Admitting: Family Medicine

## 2018-08-22 DIAGNOSIS — R928 Other abnormal and inconclusive findings on diagnostic imaging of breast: Secondary | ICD-10-CM

## 2018-08-28 ENCOUNTER — Other Ambulatory Visit: Payer: Self-pay

## 2018-08-28 ENCOUNTER — Ambulatory Visit: Payer: Medicare HMO

## 2018-08-28 ENCOUNTER — Ambulatory Visit
Admission: RE | Admit: 2018-08-28 | Discharge: 2018-08-28 | Disposition: A | Discharge status: Home / Self Care | Payer: Medicare HMO | Source: Ambulatory Visit | Attending: Family Medicine | Admitting: Family Medicine

## 2018-08-28 DIAGNOSIS — R922 Inconclusive mammogram: Secondary | ICD-10-CM | POA: Diagnosis not present

## 2018-08-28 DIAGNOSIS — R928 Other abnormal and inconclusive findings on diagnostic imaging of breast: Secondary | ICD-10-CM

## 2018-09-02 ENCOUNTER — Ambulatory Visit: Payer: Medicare HMO | Admitting: Internal Medicine

## 2018-09-06 ENCOUNTER — Other Ambulatory Visit: Payer: Self-pay | Admitting: Family Medicine

## 2018-10-08 ENCOUNTER — Other Ambulatory Visit: Payer: Self-pay

## 2018-10-08 ENCOUNTER — Telehealth: Payer: Self-pay | Admitting: Family Medicine

## 2018-10-08 DIAGNOSIS — I10 Essential (primary) hypertension: Secondary | ICD-10-CM

## 2018-10-08 MED ORDER — HYDROCHLOROTHIAZIDE 25 MG PO TABS: 25.0000 mg | ORAL_TABLET | Freq: Every day | ORAL | 1 refills | Status: DC

## 2018-10-08 MED ORDER — METOPROLOL SUCCINATE ER 25 MG PO TB24: 25.0000 mg | ORAL_TABLET | Freq: Every day | ORAL | 1 refills | Status: DC

## 2018-10-08 NOTE — Telephone Encounter (Signed)
Refills sent in and patient notified. MPulliam, CMA/RT(R)

## 2018-10-08 NOTE — Telephone Encounter (Signed)
Patient is requesting a refill of her hydrochlorothiazide and metoprolol. If approved please send both to [facility] please

## 2018-10-11 ENCOUNTER — Telehealth: Payer: Self-pay

## 2018-10-11 NOTE — Telephone Encounter (Signed)

## 2018-10-14 ENCOUNTER — Telehealth: Payer: Self-pay | Admitting: Internal Medicine

## 2018-10-15 ENCOUNTER — Telehealth (INDEPENDENT_AMBULATORY_CARE_PROVIDER_SITE_OTHER): Payer: Medicare HMO | Admitting: Internal Medicine

## 2018-10-15 ENCOUNTER — Encounter: Payer: Self-pay | Admitting: Internal Medicine

## 2018-10-15 VITALS — BP 159/92 | HR 88 | Ht <= 58 in | Wt 115.8 lb

## 2018-10-15 DIAGNOSIS — I10 Essential (primary) hypertension: Secondary | ICD-10-CM

## 2018-10-15 DIAGNOSIS — E782 Mixed hyperlipidemia: Secondary | ICD-10-CM

## 2018-10-15 DIAGNOSIS — I2510 Atherosclerotic heart disease of native coronary artery without angina pectoris: Secondary | ICD-10-CM

## 2018-10-15 DIAGNOSIS — I2584 Coronary atherosclerosis due to calcified coronary lesion: Secondary | ICD-10-CM

## 2018-10-15 NOTE — Progress Notes (Signed)
Virtual Visit via Video Note   This visit type was conducted due to national recommendations for restrictions regarding the COVID-19 Pandemic (e.g. social distancing) in an effort to limit this patient's exposure and mitigate transmission in our community.  Due to her co-morbid illnesses, this patient is at least at moderate risk for complications without adequate follow up.  This format is felt to be most appropriate for this patient at this time.  All issues noted in this document were discussed and addressed.  A limited physical exam was performed with this format.  Please refer to the patient's chart for her consent to telehealth for [facility].   Evaluation Performed:  Doximity Video visit  Date:  8/78/6767   ID:  Wendy Wheeler, DOB 07/03/1950, MRN [id number]  Patient Location:  [address] [place] [address]  Provider location:   [address] [place],  [address]  PCP:  Mellody Dance, DO  Cardiologist:  Pixie Casino, MD Electrophysiologist:  None   Chief Complaint:  No complaints  History of Present Illness:    Wendy Wheeler is a 68 y.o. female who presents via audio/video conferencing for a telehealth visit today.  Ms. Bandel is a 68 year old female with a history of coronary calcification of the LAD with no prior cardiac events, dyslipidemia and hypertension, as well as significant whitecoat hypertension.  She was last seen in September 2019 at which time her labs indicated a total cholesterol of 211 and LDL of 112.  We discussed optimization of her lipid profile as well as a target LDL less than 70.  I recommended increasing her atorvastatin to 40 mg nightly which she is tolerated well.  Her most recent lipid profile in November 2019 showed total cholesterol 180 and LDL of 70.  HDL was high at 101.  Since that visit she denies any worsening chest pain or shortness of breath.  Her blood pressure was elevated today initially and a recheck  came down but still remained elevated.  She says at times at home her blood pressure has been quite normal around 120/80 to 130/85.  She does have a follow-up visit with Dr. Raliegh Scarlet this summer.  The patient does not have symptoms concerning for COVID-19 infection (fever, chills, cough, or new SHORTNESS OF BREATH).    Prior CV studies:   The following studies were reviewed today:  Labs, office note  PMHx:  Past Medical History:  Diagnosis Date   Hyperlipidemia    Hypertension     No past surgical history on file.  FAMHx:  Family History  Problem Relation Age of Onset   Cancer Mother        breast   Hyperlipidemia Mother    Hypertension Mother    Hypertension Father    Hyperlipidemia Father    Depression Father        suicide   Cancer Sister        bladder   Heart disease Paternal Grandmother    Stroke Paternal Grandfather    Bladder Cancer Sister    Diabetes Brother    Cerebral palsy Brother     SOCHx:   reports that she has never smoked. She has never used smokeless tobacco. She reports that she does not drink alcohol or use drugs.  ALLERGIES:  Allergies  Allergen Reactions   Aleve [Naproxen Sodium] Swelling    MEDS:  Current Meds  Medication Sig   Ascorbic Acid (VITAMIN C) 1000 MG tablet Take 1,000  mg by mouth daily.   aspirin EC 81 MG tablet Take 81 mg by mouth daily.   atorvastatin (LIPITOR) 40 MG tablet Take 1 tablet (40 mg total) by mouth at bedtime.   cholecalciferol (VITAMIN D) 1000 units tablet Take 1,000 Units by mouth daily. Takes 3 times daily- 3,000 QD   hydrochlorothiazide (HYDRODIURIL) 25 MG tablet Take 1 tablet (25 mg total) by mouth daily.   LORazepam (ATIVAN) 0.5 MG tablet 1/2 to 1 tablet by mouth every 12 hrs as needed for anxiety.   Menaquinone-7 (VITAMIN K2 PO) Take 1 tablet by mouth daily.   metoprolol succinate (TOPROL-XL) 25 MG 24 hr tablet Take 1 tablet (25 mg total) by mouth daily.   Multiple  Vitamins-Minerals (HAIR SKIN AND NAILS FORMULA PO) Take 1 tablet by mouth daily.   vitamin B-12 (CYANOCOBALAMIN) 1000 MCG tablet Take 1,000 mcg by mouth daily.     ROS: Pertinent items noted in HPI and remainder of comprehensive ROS otherwise negative.  Labs/Other Tests and Data Reviewed:    Recent Labs: 05/03/2018: ALT 19; BUN 14; Creatinine, Ser 0.78; Hemoglobin 13.8; Platelets 462; Potassium 4.0; Sodium 140   Recent Lipid Panel Lab Results  Component Value Date/Time   CHOL 180 05/03/2018 08:51 AM   TRIG 46 05/03/2018 08:51 AM   HDL 101 05/03/2018 08:51 AM   CHOLHDL 1.8 05/03/2018 08:51 AM   LDLCALC 70 05/03/2018 08:51 AM    Wt Readings from Last 3 Encounters:  10/15/18 115 lb 12.8 oz (52.5 kg)  07/22/18 116 lb 9.6 oz (52.9 kg)  05/20/18 115 lb (52.2 kg)     Exam:    Vital Signs:  BP (!) 159/92    Pulse 88    Ht 4\' 10"  (1.473 m)    Wt 115 lb 12.8 oz (52.5 kg)    BMI 24.20 kg/m    General appearance: alert and no distress Lungs: no visual breathing difficulty Extremities: extremities normal, atraumatic, no cyanosis or edema Skin: Skin color, texture, turgor normal. No rashes or lesions Neurologic: Mental status: Alert, oriented, thought content appropriate Psych: Mildly anxious  ASSESSMENT & PLAN:    1. CAC of the LAD 2. Dyslipidemia 3. Hypertension  Ms. Messamore has had improvement in her lipid profile after an increase in her atorvastatin and dietary changes.  Her weight has remained fairly stable.  Her activity has declined somewhat during the COVID crisis, however I have encouraged her to increase her exercise.  Her blood pressure was still slightly high during this visit.  She says at times it is much better controlled.  She does have an element of whitecoat hypertension.  She will follow-up with her PCP regarding this.  Plan follow-up with me annually or sooner as necessary.  COVID-19 Education: The signs and symptoms of COVID-19 were discussed with the patient  and how to seek care for testing (follow up with PCP or arrange E-visit).  The importance of social distancing was discussed today.  Patient Risk:   After full review of this patients clinical status, I feel that they are at least moderate risk at this time.  Time:   Today, I have spent 25 minutes with the patient with telehealth technology discussing hypertension, dyslipidemia, coronary disease, COVID-19 education.     Medication Adjustments/Labs and Tests Ordered: Current medicines are reviewed at length with the patient today.  Concerns regarding medicines are outlined above.   Tests Ordered: No orders of the defined types were placed in this encounter.   Medication Changes:  No orders of the defined types were placed in this encounter.   Disposition:  in 1 year(s)  Pixie Casino, MD, [facility] [place] Director of the Advanced Lipid Disorders &  Cardiovascular Risk Reduction Clinic Diplomate of the American Board of Clinical Lipidology Attending Cardiologist  Direct Dial: [phone]   Fax: [phone]  Website:  [email or web address]  Pixie Casino, MD  10/15/2018 9:29 AM

## 2018-10-15 NOTE — Progress Notes (Signed)
Appointment made for 11/12/2018. MPulliam, CMA/RT(R)

## 2018-10-15 NOTE — Patient Instructions (Signed)
Medication Instructions:  Your Physician recommend you continue on your current medication as directed.    If you need a refill on your cardiac medications before your next appointment, please call your pharmacy.   Lab work: None  Testing/Procedures: None  Follow-Up: At [facility], you and your health needs are our priority.  As part of our continuing mission to provide you with exceptional heart care, we have created designated Provider Care Teams.  These Care Teams include your primary Cardiologist (physician) and Advanced Practice Providers (APPs -  Physician Assistants and Nurse Practitioners) who all work together to provide you with the care you need, when you need it. You will need a follow up appointment in 1 years.  Please call our office 2 months in advance to schedule this appointment.  You may see Pixie Casino, MD or one of the following Advanced Practice Providers on your designated Care Team: [place], [place] . Fabian Sharp, PA-C

## 2018-10-15 NOTE — Progress Notes (Signed)
Dr Debara Pickett forwarded me her last OV note with him and wanted pt to f/up with [place] re: her elevated Bp in his office recently.     Can you please call and tell her to please make sure she is checking it at home and then f/up sooner than planned if it runs 140/90 or more at home.  Also, Please have her f/up in 3 months now- not the 4-6 mo I told her- so please reschedule her for a sooner appt.  Please let her know this is per her Cardiologist- thanks   thnk you!  Dr Jenetta Downer

## 2018-10-17 ENCOUNTER — Ambulatory Visit: Payer: Medicare HMO | Admitting: Internal Medicine

## 2018-11-08 DIAGNOSIS — H52223 Regular astigmatism, bilateral: Secondary | ICD-10-CM | POA: Diagnosis not present

## 2018-11-12 ENCOUNTER — Ambulatory Visit (INDEPENDENT_AMBULATORY_CARE_PROVIDER_SITE_OTHER): Payer: Medicare HMO | Admitting: Family Medicine

## 2018-11-12 ENCOUNTER — Other Ambulatory Visit: Payer: Self-pay

## 2018-11-12 ENCOUNTER — Encounter: Payer: Self-pay | Admitting: Family Medicine

## 2018-11-12 VITALS — BP 139/82 | HR 80 | Temp 97.9°F | Ht <= 58 in | Wt 115.2 lb

## 2018-11-12 DIAGNOSIS — F430 Acute stress reaction: Secondary | ICD-10-CM | POA: Diagnosis not present

## 2018-11-12 DIAGNOSIS — I10 Essential (primary) hypertension: Secondary | ICD-10-CM | POA: Diagnosis not present

## 2018-11-12 MED ORDER — LORAZEPAM 0.5 MG PO TABS: ORAL_TABLET | ORAL | 0 refills | Status: DC
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
## 2018-12-18 NOTE — Telephone Encounter (Signed)
Open n error °

## 2018-12-23 ENCOUNTER — Ambulatory Visit: Payer: Medicare HMO | Admitting: Family Medicine

## 2019-03-05 ENCOUNTER — Telehealth: Payer: Self-pay | Admitting: Family Medicine

## 2019-03-05 NOTE — Telephone Encounter (Signed)
Patient called requested 90days supply of :  metoprolol succinate (TOPROL-XL) 25 MG 24 hr tablet [id number]   Order Details Dose: 25 mg Route: Oral Frequency: Daily  Dispense Quantity: 90 tablet Refills: 1 Fills remaining: --        Sig: Take 1 tablet (25 mg total) by mouth daily.     ---Forwarding request to medical assistant that If approved send refill order to :   [place], [place] [phone] (Phone) [phone] (Fax)   --glh

## 2019-03-06 NOTE — Telephone Encounter (Signed)
Pt advised that refill is not due until approximately 04/08/2019.  Advised pt to call back closer to this time to request refill.  Pt expressed understanding and is agreeable.  Charyl Bigger, CMA

## 2019-05-06 ENCOUNTER — Other Ambulatory Visit: Payer: Self-pay

## 2019-05-06 ENCOUNTER — Encounter: Payer: Self-pay | Admitting: Family Medicine

## 2019-05-06 ENCOUNTER — Ambulatory Visit (INDEPENDENT_AMBULATORY_CARE_PROVIDER_SITE_OTHER): Payer: Medicare HMO | Admitting: Family Medicine

## 2019-05-06 VITALS — BP 178/83 | HR 90 | Temp 97.8°F | Resp 12 | Ht <= 58 in | Wt 115.8 lb

## 2019-05-06 DIAGNOSIS — Z0000 Encounter for general adult medical examination without abnormal findings: Secondary | ICD-10-CM | POA: Diagnosis not present

## 2019-05-06 DIAGNOSIS — Z780 Asymptomatic menopausal state: Secondary | ICD-10-CM

## 2019-05-06 DIAGNOSIS — I10 Essential (primary) hypertension: Secondary | ICD-10-CM | POA: Diagnosis not present

## 2019-05-06 DIAGNOSIS — Z23 Encounter for immunization: Secondary | ICD-10-CM

## 2019-05-06 DIAGNOSIS — Z87891 Personal history of nicotine dependence: Secondary | ICD-10-CM | POA: Diagnosis not present

## 2019-05-06 DIAGNOSIS — Z1211 Encounter for screening for malignant neoplasm of colon: Secondary | ICD-10-CM

## 2019-05-06 DIAGNOSIS — E782 Mixed hyperlipidemia: Secondary | ICD-10-CM

## 2019-05-06 DIAGNOSIS — E559 Vitamin D deficiency, unspecified: Secondary | ICD-10-CM | POA: Diagnosis not present

## 2019-05-06 DIAGNOSIS — Z720 Tobacco use: Secondary | ICD-10-CM

## 2019-05-06 DIAGNOSIS — F430 Acute stress reaction: Secondary | ICD-10-CM

## 2019-05-06 DIAGNOSIS — I169 Hypertensive crisis, unspecified: Secondary | ICD-10-CM

## 2019-05-06 MED ORDER — METOPROLOL SUCCINATE ER 25 MG PO TB24: 25.0000 mg | ORAL_TABLET | Freq: Every day | ORAL | 1 refills | Status: DC

## 2019-05-06 NOTE — Progress Notes (Signed)
Subjective:   Wendy Wheeler is a 68 y.o. female who presents for Medicare Annual (Subsequent) preventive examination.  HPI: Says her blood pressure is controlled at home, running around 123-128 over 80. Confirms that she feels safe at home. Regarding her memory, says some days she wonders "where did I leave this," but "95% of the time I'm on top of it for me and my husband." Overall denies concerns. Notes she's been feeling very good lately in general. Has been exercising and adopting more prudent health habits. Says "the COVID has shot back up and we're off the gym for a few weeks, but I did two months with personal training, September and October." Says she thinks it's been around fifteen years since she quit smoking. Notes her main stress these days is the health of her husband. He has been facing several health issues over the past year, most recently a cyst.      Objective:     Vitals: BP (!) 178/83 (BP Location: Left Arm, Patient Position: Sitting, Cuff Size: Normal)   Pulse 90   Temp 97.8 F (36.6 C) (Oral)   Resp 12   Ht 4\' 10"  (1.473 m)   Wt 115 lb 12.8 oz (52.5 kg)   SpO2 97%   BMI 24.20 kg/m   Body mass index is 24.2 kg/m.  Advanced Directives 01/17/2017  Does Patient Have a Medical Advance Directive? No  Would patient like information on creating a medical advance directive? No - Patient declined    Tobacco Social History   Tobacco Use  Smoking Status Never Smoker  Smokeless Tobacco Never Used     Counseling given: Not Answered   Clinical Intake:                       Past Medical History:  Diagnosis Date  . Hyperlipidemia   . Hypertension    History reviewed. No pertinent surgical history. Family History  Problem Relation Age of Onset  . Cancer Mother        breast  . Hyperlipidemia Mother   . Hypertension Mother   . Hypertension Father   . Hyperlipidemia Father   . Depression Father        suicide  . Cancer Sister        bladder   . Heart disease Paternal Grandmother   . Stroke Paternal Grandfather   . Bladder Cancer Sister   . Diabetes Brother   . Cerebral palsy Brother    Social History   Socioeconomic History  . Marital status: Married    Spouse name: Not on file  . Number of children: Not on file  . Years of education: Not on file  . Highest education level: Not on file  Occupational History  . Not on file  Social Needs  . Financial resource strain: Not on file  . Food insecurity    Worry: Not on file    Inability: Not on file  . Transportation needs    Medical: Not on file    Non-medical: Not on file  Tobacco Use  . Smoking status: Never Smoker  . Smokeless tobacco: Never Used  Substance and Sexual Activity  . Alcohol use: No  . Drug use: No  . Sexual activity: Yes    Partners: Male  Lifestyle  . Physical activity    Days per week: Not on file    Minutes per session: Not on file  . Stress: Not on file  Relationships  .  Social Herbalist on phone: Not on file    Gets together: Not on file    Attends religious service: Not on file    Active member of club or organization: Not on file    Attends meetings of clubs or organizations: Not on file    Relationship status: Not on file  Other Topics Concern  . Not on file  Social History Narrative  . Not on file    Outpatient Encounter Medications as of 05/06/2019  Medication Sig  . Ascorbic Acid (VITAMIN C) 1000 MG tablet Take 1,000 mg by mouth daily.  Marland Kitchen aspirin EC 81 MG tablet Take 81 mg by mouth daily.  Marland Kitchen atorvastatin (LIPITOR) 40 MG tablet Take 1 tablet (40 mg total) by mouth at bedtime.  . cholecalciferol (VITAMIN D) 1000 units tablet Take 1,000 Units by mouth daily. Takes 3 times daily- 3,000 QD  . hydrochlorothiazide (HYDRODIURIL) 25 MG tablet Take 1 tablet (25 mg total) by mouth daily.  Marland Kitchen LORazepam (ATIVAN) 0.5 MG tablet 1/2 to 1 tablet by mouth prn panic attacks/ intense anxiety  . Menaquinone-7 (VITAMIN K2 PO) Take 1  tablet by mouth daily.  . metoprolol succinate (TOPROL-XL) 25 MG 24 hr tablet Take 1 tablet (25 mg total) by mouth daily.  . Multiple Vitamins-Minerals (HAIR SKIN AND NAILS FORMULA PO) Take 1 tablet by mouth daily.  . vitamin B-12 (CYANOCOBALAMIN) 1000 MCG tablet Take 1,000 mcg by mouth daily.  . [DISCONTINUED] LORazepam (ATIVAN) 0.5 MG tablet 1/2 to 1 tablet by mouth prn panic attacks/ intense anxiety  . [DISCONTINUED] metoprolol succinate (TOPROL-XL) 25 MG 24 hr tablet Take 1 tablet (25 mg total) by mouth daily.   No facility-administered encounter medications on file as of 05/06/2019.     Activities of Daily Living In your present state of health, do you have any difficulty performing the following activities: 05/06/2019 07/22/2018  Hearing? N N  Vision? N N  Difficulty concentrating or making decisions? N N  Walking or climbing stairs? N N  Dressing or bathing? N N  Doing errands, shopping? N N  Some recent data might be hidden    Patient Care Team: Mellody Dance, DO as PCP - General (Family Medicine) Debara Pickett Nadean Corwin, MD as PCP - Cardiology (Cardiology)    Assessment:   This is a routine wellness examination for Wendy Wheeler.  Exercise Activities and Dietary recommendations    Goals   None     Fall Risk Fall Risk  05/06/2019 07/22/2018 04/02/2018 12/31/2017 10/30/2017  Falls in the past year? 0 0 No No Yes  Number falls in past yr: 0 - - - 1  Injury with Fall? 0 - - - Yes  Follow up Falls evaluation completed Falls evaluation completed - - Falls evaluation completed   Is the patient's home free of loose throw rugs in walkways, pet beds, electrical cords, etc?   yes     Grab bars in the bathroom? no     Handrails on the stairs?  yes     Adequate lighting?   yes  Timed Get Up and Go performed:  Depression Screen PHQ 2/9 Scores 05/06/2019 11/12/2018 07/22/2018 05/20/2018  PHQ - 2 Score 0 0 0 0  PHQ- 9 Score 1 1 2 2      Cognitive Function     6CIT Screen 04/02/2018  What  Year? 0 points  What month? 0 points  What time? 0 points  Count back from 20 0 points  Months  in reverse 0 points  Repeat phrase 4 points  Total Score 4    Immunization History  Administered Date(s) Administered  . Influenza, High Dose Seasonal PF 03/20/2017    Qualifies for Shingles Vaccine?yes but patient declines Pneumococcal vaccine: Patient declines Tdap Vaccine: patient states done in 2012 in [place], [place]- tracking down.   Screening Tests Health Maintenance  Topic Date Due  . INFLUENZA VACCINE  01/18/2019  . COLONOSCOPY  07/23/2019 (Originally 02/22/2002)  . TETANUS/TDAP  07/23/2019 (Originally 02/23/1971)  . PNA vac Low Risk Adult (1 of 2 - PCV13) 07/23/2019 (Originally 02/22/2017)  . MAMMOGRAM  08/20/2020  . DEXA SCAN  Completed  . Hepatitis C Screening  Completed    Cancer Screenings: Lung: Low Dose CT Chest recommended if Age 52-80 years, 30 pack-year currently smoking OR have quit w/in 15years. Patient does not qualify. Breast:  Up to date on Mammogram? Yes - done 08/28/2018 Up to date of Bone Density/Dexa? Yes - tracking down   Additional Screenings:  Hepatitis C Screening: performed 05/03/2018 HIV Screening: performed 05/03/2018     Plan:  8:15 AM 11/17 Medicare Wellness PLAN: - Advised patient to obtain mammograms yearly. Last obtained March of 2020, [place], repeat March '21. - Last pap smear obtained prior to age 61, WNL. - Per patient, recently obtained stool cards [facility]. - Discussed that colonoscopy is the gold standard screening for colon cancer. - Per patient, obtained ColoGuard a couple of years ago prior to establishing with clinic. - Advised patient to call [employer] and see if they will cover another ColoGuard screen, repeat every 3 years. - TDAP last obtained in 2012. Advised repeat every 5 years with new injury, otherwise 10 years. - Need for shingles vaccination. Patient declined today. Discussed indications, risks & benefits with patient today.  Handout provided. - Need for pneumonia vaccination. Patient declined today. Discussed indications, risks & benefits with patient today. - Reviewed indications for low-dose CT scan for lung cancer. - If patient is due, she would like to obtain CT scan. - Need for DEXA, repeat every two years. Per patient, obtained through [facility] imaging one year ago. - Need for influenza vaccination. Agrees today. Discussed indications, risks & benefits with patient today. - Extensive education provided regarding screenings and immunizations. Lengthy conversation held and all questions answered. - Labs drawn today. See orders. - Discussed prudent self-breast exam screening habits with patient today. -Patient last had CT chest for screening lung cancer back in 12/2017.  Recommended annually for follow-up.  We will place that referral again today.  Orders Placed This Encounter  Procedures  . DG Bone Density  . CT CHEST LUNG CA SCREEN LOW DOSE W/O CM  . Flu Vaccine QUAD High Dose(Fluad)  . CBC with Differential/Platelet  . CMP w GFR  . Hgb A1c  . Lipid panel  . TSH  . T4, free  . Vit D  . Cologuard    I have personally reviewed and noted the following in the patient's chart:   . Medical and social history . Use of alcohol, tobacco or illicit drugs  . Current medications and supplements . Functional ability and status . Nutritional status . Physical activity . Advanced directives . List of other physicians . Hospitalizations, surgeries, and ER visits in previous 12 months . Vitals . Screenings to include cognitive, depression, and falls . Referrals and appointments  In addition, I have reviewed and discussed with patient certain preventive protocols, quality metrics, and best practice recommendations. A written personalized  care plan for preventive services as well as general preventive health recommendations were provided to patient.     Please see recent request for Ativan refill. Sent 130-day  supply as the last refill was back in May for 30 days.  This falls in the category of having 2 prescriptions of 30 days in less than 12 months Mellody Dance, DO 05/09/2019 12:53 PM

## 2019-05-06 NOTE — Patient Instructions (Signed)
Preventive Care for Adults, Female  A healthy lifestyle and preventive care can promote health and wellness. Preventive health guidelines for women include the following key practices.   A routine yearly physical is a good way to check with your health care provider about your health and preventive screening. It is a chance to share any concerns and updates on your health and to receive a thorough exam.   Visit your dentist for a routine exam and preventive care every 6 months. Brush your teeth twice a day and floss once a day. Good oral hygiene prevents tooth decay and gum disease.   The frequency of eye exams is based on your age, health, family medical history, use of contact lenses, and other factors. Follow your health care provider's recommendations for frequency of eye exams.   Eat a healthy diet. Foods like vegetables, fruits, whole grains, low-fat dairy products, and lean protein foods contain the nutrients you need without too many calories. Decrease your intake of foods high in solid fats, added sugars, and salt. Eat the right amount of calories for you.Get information about a proper diet from your health care provider, if necessary.   Regular physical exercise is one of the most important things you can do for your health. Most adults should get at least 150 minutes of moderate-intensity exercise (any activity that increases your heart rate and causes you to sweat) each week. In addition, most adults need muscle-strengthening exercises on 2 or more days a week.   Maintain a healthy weight. The body mass index (BMI) is a screening tool to identify possible weight problems. It provides an estimate of body fat based on height and weight. Your health care provider can find your BMI, and can help you achieve or maintain a healthy weight.For adults 20 years and older:   - A BMI below 18.5 is considered underweight.   - A BMI of 18.5 to 24.9 is normal.   - A BMI of 25 to 29.9 is  considered overweight.   - A BMI of 30 and above is considered obese.   Maintain normal blood lipids and cholesterol levels by exercising and minimizing your intake of trans and saturated fats.  Eat a balanced diet with plenty of fruit and vegetables. Blood tests for lipids and cholesterol should begin at age 20 and be repeated every 5 years minimum.  If your lipid or cholesterol levels are high, you are over 40, or you are at high risk for heart disease, you may need your cholesterol levels checked more frequently.Ongoing high lipid and cholesterol levels should be treated with medicines if diet and exercise are not working.   If you smoke, find out from your health care provider how to quit. If you do not use tobacco, do not start.   Lung cancer screening is recommended for adults aged 55-80 years who are at high risk for developing lung cancer because of a history of smoking. A yearly low-dose CT scan of the lungs is recommended for people who have at least a 30-pack-year history of smoking and are a current smoker or have quit within the past 15 years. A pack year of smoking is smoking an average of 1 pack of cigarettes a day for 1 year (for example: 1 pack a day for 30 years or 2 packs a day for 15 years). Yearly screening should continue until the smoker has stopped smoking for at least 15 years. Yearly screening should be stopped for people who develop a   health problem that would prevent them from having lung cancer treatment.   If you are pregnant, do not drink alcohol. If you are breastfeeding, be very cautious about drinking alcohol. If you are not pregnant and choose to drink alcohol, do not have more than 1 drink per day. One drink is considered to be 12 ounces (355 mL) of beer, 5 ounces (148 mL) of wine, or 1.5 ounces (44 mL) of liquor.   Avoid use of street drugs. Do not share needles with anyone. Ask for help if you need support or instructions about stopping the use of  drugs.   High blood pressure causes heart disease and increases the risk of stroke. Your blood pressure should be checked at least yearly.  Ongoing high blood pressure should be treated with medicines if weight loss and exercise do not work.   If you are 69-55 years old, ask your health care provider if you should take aspirin to prevent strokes.   Diabetes screening involves taking a blood sample to check your fasting blood sugar level. This should be done once every 3 years, after age 38, if you are within normal weight and without risk factors for diabetes. Testing should be considered at a younger age or be carried out more frequently if you are overweight and have at least 1 risk factor for diabetes.   Breast cancer screening is essential preventive care for women. You should practice "breast self-awareness."  This means understanding the normal appearance and feel of your breasts and may include breast self-examination.  Any changes detected, no matter how small, should be reported to a health care provider.  Women in their 80s and 30s should have a clinical breast exam (CBE) by a health care provider as part of a regular health exam every 1 to 3 years.  After age 66, women should have a CBE every year.  Starting at age 1, women should consider having a mammogram (breast X-ray test) every year.  Women who have a family history of breast cancer should talk to their health care provider about genetic screening.  Women at a high risk of breast cancer should talk to their health care providers about having an MRI and a mammogram every year.   -Breast cancer gene (BRCA)-related cancer risk assessment is recommended for women who have family members with BRCA-related cancers. BRCA-related cancers include breast, ovarian, tubal, and peritoneal cancers. Having family members with these cancers may be associated with an increased risk for harmful changes (mutations) in the breast cancer genes BRCA1 and  BRCA2. Results of the assessment will determine the need for genetic counseling and BRCA1 and BRCA2 testing.   The Pap test is a screening test for cervical cancer. A Pap test can show cell changes on the cervix that might become cervical cancer if left untreated. A Pap test is a procedure in which cells are obtained and examined from the lower end of the uterus (cervix).   - Women should have a Pap test starting at age 57.   - Between ages 90 and 70, Pap tests should be repeated every 2 years.   - Beginning at age 63, you should have a Pap test every 3 years as long as the past 3 Pap tests have been normal.   - Some women have medical problems that increase the chance of getting cervical cancer. Talk to your health care provider about these problems. It is especially important to talk to your health care provider if a  new problem develops soon after your last Pap test. In these cases, your health care provider may recommend more frequent screening and Pap tests.   - The above recommendations are the same for women who have or have not gotten the vaccine for human papillomavirus (HPV).   - If you had a hysterectomy for a problem that was not cancer or a condition that could lead to cancer, then you no longer need Pap tests. Even if you no longer need a Pap test, a regular exam is a good idea to make sure no other problems are starting.   - If you are between ages 36 and 66 years, and you have had normal Pap tests going back 10 years, you no longer need Pap tests. Even if you no longer need a Pap test, a regular exam is a good idea to make sure no other problems are starting.   - If you have had past treatment for cervical cancer or a condition that could lead to cancer, you need Pap tests and screening for cancer for at least 20 years after your treatment.   - If Pap tests have been discontinued, risk factors (such as a new sexual partner) need to be reassessed to determine if screening should  be resumed.   - The HPV test is an additional test that may be used for cervical cancer screening. The HPV test looks for the virus that can cause the cell changes on the cervix. The cells collected during the Pap test can be tested for HPV. The HPV test could be used to screen women aged 70 years and older, and should be used in women of any age who have unclear Pap test results. After the age of 67, women should have HPV testing at the same frequency as a Pap test.   Colorectal cancer can be detected and often prevented. Most routine colorectal cancer screening begins at the age of 57 years and continues through age 26 years. However, your health care provider may recommend screening at an earlier age if you have risk factors for colon cancer. On a yearly basis, your health care provider may provide home test kits to check for hidden blood in the stool.  Use of a small camera at the end of a tube, to directly examine the colon (sigmoidoscopy or colonoscopy), can detect the earliest forms of colorectal cancer. Talk to your health care provider about this at age 23, when routine screening begins. Direct exam of the colon should be repeated every 5 -10 years through age 49 years, unless early forms of pre-cancerous polyps or small growths are found.   People who are at an increased risk for hepatitis B should be screened for this virus. You are considered at high risk for hepatitis B if:  -You were born in a country where hepatitis B occurs often. Talk with your health care provider about which countries are considered high risk.  - Your parents were born in a high-risk country and you have not received a shot to protect against hepatitis B (hepatitis B vaccine).  - You have HIV or AIDS.  - You use needles to inject street drugs.  - You live with, or have sex with, someone who has Hepatitis B.  - You get hemodialysis treatment.  - You take certain medicines for conditions like cancer, organ  transplantation, and autoimmune conditions.   Hepatitis C blood testing is recommended for all people born from [birth year] through 1965 and any individual  with known risks for hepatitis C.   Practice safe sex. Use condoms and avoid high-risk sexual practices to reduce the spread of sexually transmitted infections (STIs). STIs include gonorrhea, chlamydia, syphilis, trichomonas, herpes, HPV, and human immunodeficiency virus (HIV). Herpes, HIV, and HPV are viral illnesses that have no cure. They can result in disability, cancer, and death. Sexually active women aged 25 years and younger should be checked for chlamydia. Older women with new or multiple partners should also be tested for chlamydia. Testing for other STIs is recommended if you are sexually active and at increased risk.   Osteoporosis is a disease in which the bones lose minerals and strength with aging. This can result in serious bone fractures or breaks. The risk of osteoporosis can be identified using a bone density scan. Women ages 65 years and over and women at risk for fractures or osteoporosis should discuss screening with their health care providers. Ask your health care provider whether you should take a calcium supplement or vitamin D to There are also several preventive steps women can take to avoid osteoporosis and resulting fractures or to keep osteoporosis from worsening. -->Recommendations include:  Eat a balanced diet high in fruits, vegetables, calcium, and vitamins.  Get enough calcium. The recommended total intake of is 1,200 mg daily; for best absorption, if taking supplements, divide doses into 250-500 mg doses throughout the day. Of the two types of calcium, calcium carbonate is best absorbed when taken with food but calcium citrate can be taken on an empty stomach.  Get enough vitamin D. NAMS and the National Osteoporosis Foundation recommend at least 1,000 IU per day for women age 50 and over who are at risk of vitamin D  deficiency. Vitamin D deficiency can be caused by inadequate sun exposure (for example, those who live in northern latitudes).  Avoid alcohol and smoking. Heavy alcohol intake (more than 7 drinks per week) increases the risk of falls and hip fracture and women smokers tend to lose bone more rapidly and have lower bone mass than nonsmokers. Stopping smoking is one of the most important changes women can make to improve their health and decrease risk for disease.  Be physically active every day. Weight-bearing exercise (for example, fast walking, hiking, jogging, and weight training) may strengthen bones or slow the rate of bone loss that comes with aging. Balancing and muscle-strengthening exercises can reduce the risk of falling and fracture.  Consider therapeutic medications. Currently, several types of effective drugs are available. Healthcare providers can recommend the type most appropriate for each woman.  Eliminate environmental factors that may contribute to accidents. Falls cause nearly 90% of all osteoporotic fractures, so reducing this risk is an important bone-health strategy. Measures include ample lighting, removing obstructions to walking, using nonskid rugs on floors, and placing mats and/or grab bars in showers.  Be aware of medication side effects. Some common medicines make bones weaker. These include a type of steroid drug called glucocorticoids used for arthritis and asthma, some antiseizure drugs, certain sleeping pills, treatments for endometriosis, and some cancer drugs. An overactive thyroid gland or using too much thyroid hormone for an underactive thyroid can also be a problem. If you are taking these medicines, talk to your doctor about what you can do to help protect your bones.reduce the rate of osteoporosis.    Menopause can be associated with physical symptoms and risks. Hormone replacement therapy is available to decrease symptoms and risks. You should talk to your  health care provider   about whether hormone replacement therapy is right for you.   Use sunscreen. Apply sunscreen liberally and repeatedly throughout the day. You should seek shade when your shadow is shorter than you. Protect yourself by wearing long sleeves, pants, a wide-brimmed hat, and sunglasses year round, whenever you are outdoors.   Once a month, do a whole body skin exam, using a mirror to look at the skin on your back. Tell your health care provider of new moles, moles that have irregular borders, moles that are larger than a pencil eraser, or moles that have changed in shape or color.   -Stay current with required vaccines (immunizations).   Influenza vaccine. All adults should be immunized every year.  Tetanus, diphtheria, and acellular pertussis (Td, Tdap) vaccine. Pregnant women should receive 1 dose of Tdap vaccine during each pregnancy. The dose should be obtained regardless of the length of time since the last dose. Immunization is preferred during the 27th 36th week of gestation. An adult who has not previously received Tdap or who does not know her vaccine status should receive 1 dose of Tdap. This initial dose should be followed by tetanus and diphtheria toxoids (Td) booster doses every 10 years. Adults with an unknown or incomplete history of completing a 3-dose immunization series with Td-containing vaccines should begin or complete a primary immunization series including a Tdap dose. Adults should receive a Td booster every 10 years.  Varicella vaccine. An adult without evidence of immunity to varicella should receive 2 doses or a second dose if she has previously received 1 dose. Pregnant females who do not have evidence of immunity should receive the first dose after pregnancy. This first dose should be obtained before leaving the health care facility. The second dose should be obtained 4 8 weeks after the first dose.  Human papillomavirus (HPV) vaccine. Females aged 13 26  years who have not received the vaccine previously should obtain the 3-dose series. The vaccine is not recommended for use in pregnant females. However, pregnancy testing is not needed before receiving a dose. If a female is found to be pregnant after receiving a dose, no treatment is needed. In that case, the remaining doses should be delayed until after the pregnancy. Immunization is recommended for any person with an immunocompromised condition through the age of 26 years if she did not get any or all doses earlier. During the 3-dose series, the second dose should be obtained 4 8 weeks after the first dose. The third dose should be obtained 24 weeks after the first dose and 16 weeks after the second dose.  Zoster vaccine. One dose is recommended for adults aged 60 years or older unless certain conditions are present.  Measles, mumps, and rubella (MMR) vaccine. Adults born before 1957 generally are considered immune to measles and mumps. Adults born in 1957 or later should have 1 or more doses of MMR vaccine unless there is a contraindication to the vaccine or there is laboratory evidence of immunity to each of the three diseases. A routine second dose of MMR vaccine should be obtained at least 28 days after the first dose for students attending postsecondary schools, health care workers, or international travelers. People who received inactivated measles vaccine or an unknown type of measles vaccine during 1963 1967 should receive 2 doses of MMR vaccine. People who received inactivated mumps vaccine or an unknown type of mumps vaccine before 1979 and are at high risk for mumps infection should consider immunization with 2 doses of   MMR vaccine. For females of childbearing age, rubella immunity should be determined. If there is no evidence of immunity, females who are not pregnant should be vaccinated. If there is no evidence of immunity, females who are pregnant should delay immunization until after pregnancy.  Unvaccinated health care workers born before [birth year] who lack laboratory evidence of measles, mumps, or rubella immunity or laboratory confirmation of disease should consider measles and mumps immunization with 2 doses of MMR vaccine or rubella immunization with 1 dose of MMR vaccine.  Pneumococcal 13-valent conjugate (PCV13) vaccine. When indicated, a person who is uncertain of her immunization history and has no record of immunization should receive the PCV13 vaccine. An adult aged 54 years or older who has certain medical conditions and has not been previously immunized should receive 1 dose of PCV13 vaccine. This PCV13 should be followed with a dose of pneumococcal polysaccharide (PPSV23) vaccine. The PPSV23 vaccine dose should be obtained at least 8 weeks after the dose of PCV13 vaccine. An adult aged 58 years or older who has certain medical conditions and previously received 1 or more doses of PPSV23 vaccine should receive 1 dose of PCV13. The PCV13 vaccine dose should be obtained 1 or more years after the last PPSV23 vaccine dose.  Pneumococcal polysaccharide (PPSV23) vaccine. When PCV13 is also indicated, PCV13 should be obtained first. All adults aged 58 years and older should be immunized. An adult younger than age 65 years who has certain medical conditions should be immunized. Any person who resides in a nursing home or long-term care facility should be immunized. An adult smoker should be immunized. People with an immunocompromised condition and certain other conditions should receive both PCV13 and PPSV23 vaccines. People with human immunodeficiency virus (HIV) infection should be immunized as soon as possible after diagnosis. Immunization during chemotherapy or radiation therapy should be avoided. Routine use of PPSV23 vaccine is not recommended for American Indians, [place] Natives, or people younger than 65 years unless there are medical conditions that require PPSV23 vaccine. When indicated,  people who have unknown immunization and have no record of immunization should receive PPSV23 vaccine. One-time revaccination 5 years after the first dose of PPSV23 is recommended for people aged 70 64 years who have chronic kidney failure, nephrotic syndrome, asplenia, or immunocompromised conditions. People who received 1 2 doses of PPSV23 before age 32 years should receive another dose of PPSV23 vaccine at age 96 years or later if at least 5 years have passed since the previous dose. Doses of PPSV23 are not needed for people immunized with PPSV23 at or after age 55 years.  Meningococcal vaccine. Adults with asplenia or persistent complement component deficiencies should receive 2 doses of quadrivalent meningococcal conjugate (MenACWY-D) vaccine. The doses should be obtained at least 2 months apart. Microbiologists working with certain meningococcal bacteria, [employer] recruits, people at risk during an outbreak, and people who travel to or live in countries with a high rate of meningitis should be immunized. A first-year college student up through age 58 years who is living in a residence hall should receive a dose if she did not receive a dose on or after her 16th birthday. Adults who have certain high-risk conditions should receive one or more doses of vaccine.  Hepatitis A vaccine. Adults who wish to be protected from this disease, have certain high-risk conditions, work with hepatitis A-infected animals, work in hepatitis A research labs, or travel to or work in countries with a high rate of hepatitis A should be  immunized. Adults who were previously unvaccinated and who anticipate close contact with an international adoptee during the first 60 days after arrival in the [place] from a country with a high rate of hepatitis A should be immunized.  Hepatitis B vaccine.  Adults who wish to be protected from this disease, have certain high-risk conditions, may be exposed to blood or other infectious  body fluids, are household contacts or sex partners of hepatitis B positive people, are clients or workers in certain care facilities, or travel to or work in countries with a high rate of hepatitis B should be immunized.  Haemophilus influenzae type b (Hib) vaccine. A previously unvaccinated person with asplenia or sickle cell disease or having a scheduled splenectomy should receive 1 dose of Hib vaccine. Regardless of previous immunization, a recipient of a hematopoietic stem cell transplant should receive a 3-dose series 6 12 months after her successful transplant. Hib vaccine is not recommended for adults with HIV infection.  Preventive Services / Frequency Ages 6 to 39years  Blood pressure check.** / Every 1 to 2 years.  Lipid and cholesterol check.** / Every 5 years beginning at age 39.  Clinical breast exam.** / Every 3 years for women in their 61s and 62s.  BRCA-related cancer risk assessment.** / For women who have family members with a BRCA-related cancer (breast, ovarian, tubal, or peritoneal cancers).  Pap test.** / Every 2 years from ages 47 through 85. Every 3 years starting at age 34 through age 12 or 74 with a history of 3 consecutive normal Pap tests.  HPV screening.** / Every 3 years from ages 46 through ages 43 to 54 with a history of 3 consecutive normal Pap tests.  Hepatitis C blood test.** / For any individual with known risks for hepatitis C.  Skin self-exam. / Monthly.  Influenza vaccine. / Every year.  Tetanus, diphtheria, and acellular pertussis (Tdap, Td) vaccine.** / Consult your health care provider. Pregnant women should receive 1 dose of Tdap vaccine during each pregnancy. 1 dose of Td every 10 years.  Varicella vaccine.** / Consult your health care provider. Pregnant females who do not have evidence of immunity should receive the first dose after pregnancy.  HPV vaccine. / 3 doses over 6 months, if 64 and younger. The vaccine is not recommended for use in  pregnant females. However, pregnancy testing is not needed before receiving a dose.  Measles, mumps, rubella (MMR) vaccine.** / You need at least 1 dose of MMR if you were born in 1957 or later. You may also need a 2nd dose. For females of childbearing age, rubella immunity should be determined. If there is no evidence of immunity, females who are not pregnant should be vaccinated. If there is no evidence of immunity, females who are pregnant should delay immunization until after pregnancy.  Pneumococcal 13-valent conjugate (PCV13) vaccine.** / Consult your health care provider.  Pneumococcal polysaccharide (PPSV23) vaccine.** / 1 to 2 doses if you smoke cigarettes or if you have certain conditions.  Meningococcal vaccine.** / 1 dose if you are age 71 to 38 years and a Market researcher living in a residence hall, or have one of several medical conditions, you need to get vaccinated against meningococcal disease. You may also need additional booster doses.  Hepatitis A vaccine.** / Consult your health care provider.  Hepatitis B vaccine.** / Consult your health care provider.  Haemophilus influenzae type b (Hib) vaccine.** / Consult your health care provider.  Ages 55 to 64years  Blood pressure check.** / Every 1 to 2 years.  Lipid and cholesterol check.** / Every 5 years beginning at age 20 years.  Lung cancer screening. / Every year if you are aged 55 80 years and have a 30-pack-year history of smoking and currently smoke or have quit within the past 15 years. Yearly screening is stopped once you have quit smoking for at least 15 years or develop a health problem that would prevent you from having lung cancer treatment.  Clinical breast exam.** / Every year after age 40 years.  BRCA-related cancer risk assessment.** / For women who have family members with a BRCA-related cancer (breast, ovarian, tubal, or peritoneal cancers).  Mammogram.** / Every year beginning at age 40  years and continuing for as long as you are in good health. Consult with your health care provider.  Pap test.** / Every 3 years starting at age 30 years through age 65 or 70 years with a history of 3 consecutive normal Pap tests.  HPV screening.** / Every 3 years from ages 30 years through ages 65 to 70 years with a history of 3 consecutive normal Pap tests.  Fecal occult blood test (FOBT) of stool. / Every year beginning at age 50 years and continuing until age 75 years. You may not need to do this test if you get a colonoscopy every 10 years.  Flexible sigmoidoscopy or colonoscopy.** / Every 5 years for a flexible sigmoidoscopy or every 10 years for a colonoscopy beginning at age 50 years and continuing until age 75 years.  Hepatitis C blood test.** / For all people born from 1945 through 1965 and any individual with known risks for hepatitis C.  Skin self-exam. / Monthly.  Influenza vaccine. / Every year.  Tetanus, diphtheria, and acellular pertussis (Tdap/Td) vaccine.** / Consult your health care provider. Pregnant women should receive 1 dose of Tdap vaccine during each pregnancy. 1 dose of Td every 10 years.  Varicella vaccine.** / Consult your health care provider. Pregnant females who do not have evidence of immunity should receive the first dose after pregnancy.  Zoster vaccine.** / 1 dose for adults aged 60 years or older.  Measles, mumps, rubella (MMR) vaccine.** / You need at least 1 dose of MMR if you were born in 1957 or later. You may also need a 2nd dose. For females of childbearing age, rubella immunity should be determined. If there is no evidence of immunity, females who are not pregnant should be vaccinated. If there is no evidence of immunity, females who are pregnant should delay immunization until after pregnancy.  Pneumococcal 13-valent conjugate (PCV13) vaccine.** / Consult your health care provider.  Pneumococcal polysaccharide (PPSV23) vaccine.** / 1 to 2 doses if  you smoke cigarettes or if you have certain conditions.  Meningococcal vaccine.** / Consult your health care provider.  Hepatitis A vaccine.** / Consult your health care provider.  Hepatitis B vaccine.** / Consult your health care provider.  Haemophilus influenzae type b (Hib) vaccine.** / Consult your health care provider.  Ages 65 years and over  Blood pressure check.** / Every 1 to 2 years.  Lipid and cholesterol check.** / Every 5 years beginning at age 20 years.  Lung cancer screening. / Every year if you are aged 55 80 years and have a 30-pack-year history of smoking and currently smoke or have quit within the past 15 years. Yearly screening is stopped once you have quit smoking for at least 15 years or develop a health problem that   would prevent you from having lung cancer treatment.  Clinical breast exam.** / Every year after age 103 years.  BRCA-related cancer risk assessment.** / For women who have family members with a BRCA-related cancer (breast, ovarian, tubal, or peritoneal cancers).  Mammogram.** / Every year beginning at age 36 years and continuing for as long as you are in good health. Consult with your health care provider.  Pap test.** / Every 3 years starting at age 5 years through age 85 or 10 years with 3 consecutive normal Pap tests. Testing can be stopped between 65 and 70 years with 3 consecutive normal Pap tests and no abnormal Pap or HPV tests in the past 10 years.  HPV screening.** / Every 3 years from ages 93 years through ages 70 or 45 years with a history of 3 consecutive normal Pap tests. Testing can be stopped between 65 and 70 years with 3 consecutive normal Pap tests and no abnormal Pap or HPV tests in the past 10 years.  Fecal occult blood test (FOBT) of stool. / Every year beginning at age 8 years and continuing until age 45 years. You may not need to do this test if you get a colonoscopy every 10 years.  Flexible sigmoidoscopy or colonoscopy.** /  Every 5 years for a flexible sigmoidoscopy or every 10 years for a colonoscopy beginning at age 69 years and continuing until age 68 years.  Hepatitis C blood test.** / For all people born from [birth year] through 1964 and any individual with known risks for hepatitis C.  Osteoporosis screening.** / A one-time screening for women ages 7 years and over and women at risk for fractures or osteoporosis.  Skin self-exam. / Monthly.  Influenza vaccine. / Every year.  Tetanus, diphtheria, and acellular pertussis (Tdap/Td) vaccine.** / 1 dose of Td every 10 years.  Varicella vaccine.** / Consult your health care provider.  Zoster vaccine.** / 1 dose for adults aged 5 years or older.  Pneumococcal 13-valent conjugate (PCV13) vaccine.** / Consult your health care provider.  Pneumococcal polysaccharide (PPSV23) vaccine.** / 1 dose for all adults aged 74 years and older.  Meningococcal vaccine.** / Consult your health care provider.  Hepatitis A vaccine.** / Consult your health care provider.  Hepatitis B vaccine.** / Consult your health care provider.  Haemophilus influenzae type b (Hib) vaccine.** / Consult your health care provider. ** Family history and personal history of risk and conditions may change your health care provider's recommendations. Document Released: 08/01/2001 Document Revised: 03/26/2013  [facility] Patient Information 2014 [place], [place].   EXERCISE AND DIET:  We recommended that you start or continue a regular exercise program for good health. Regular exercise means any activity that makes your heart beat faster and makes you sweat.  We recommend exercising at least 30 minutes per day at least 3 days a week, preferably 5.  We also recommend a diet low in fat and sugar / carbohydrates.  Inactivity, poor dietary choices and obesity can cause diabetes, heart attack, stroke, and kidney damage, among others.     ALCOHOL AND SMOKING:  Women should limit their alcohol intake to no  more than 7 drinks/beers/glasses of wine (combined, not each!) per week. Moderation of alcohol intake to this level decreases your risk of breast cancer and liver damage.  ( And of course, no recreational drugs are part of a healthy lifestyle.)  Also, you should not be smoking at all or even being exposed to second hand smoke. Most people know smoking can  cause cancer, and various heart and lung diseases, but did you know it also contributes to weakening of your bones?  Aging of your skin?  Yellowing of your teeth and nails?   CALCIUM AND VITAMIN D:  Adequate intake of calcium and Vitamin D are recommended.  The recommendations for exact amounts of these supplements seem to change often, but generally speaking 600 mg of calcium (either carbonate or citrate) and 800 units of Vitamin D per day seems prudent. Certain women may benefit from higher intake of Vitamin D.  If you are among these women, your doctor will have told you during your visit.     PAP SMEARS:  Pap smears, to check for cervical cancer or precancers,  have traditionally been done yearly, although recent scientific advances have shown that most women can have pap smears less often.  However, every woman still should have a physical exam from her gynecologist or primary care physician every year. It will include a breast check, inspection of the vulva and vagina to check for abnormal growths or skin changes, a visual exam of the cervix, and then an exam to evaluate the size and shape of the uterus and ovaries.  And after 67 years of age, a rectal exam is indicated to check for rectal cancers. We will also provide age appropriate advice regarding health maintenance, like when you should have certain vaccines, screening for sexually transmitted diseases, bone density testing, colonoscopy, mammograms, etc.    MAMMOGRAMS:  All women over 71 years old should have a yearly mammogram. Many facilities now offer a "3D" mammogram, which may cost  around $50 extra out of pocket. If possible,  we recommend you accept the option to have the 3D mammogram performed.  It both reduces the number of women who will be called back for extra views which then turn out to be normal, and it is better than the routine mammogram at detecting truly abnormal areas.     COLONOSCOPY:  Colonoscopy to screen for colon cancer is recommended for all women at age 52.  We know, you hate the idea of the prep.  We agree, BUT, having colon cancer and not knowing it is worse!!  Colon cancer so often starts as a polyp that can be seen and removed at colonscopy, which can quite literally save your life!  And if your first colonoscopy is normal and you have no family history of colon cancer, most women don't have to have it again for 10 years.  Once every ten years, you can do something that may end up saving your life, right?  We will be happy to help you get it scheduled when you are ready.  Be sure to check your insurance coverage so you understand how much it will cost.  It may be covered as a preventative service at no cost, but you should check your particular policy.

## 2019-05-07 LAB — CBC WITH DIFFERENTIAL/PLATELET
Basophils Absolute: 0 10*3/uL (ref 0.0–0.2)
Basos: 0 %
EOS (ABSOLUTE): 0.2 10*3/uL (ref 0.0–0.4)
Eos: 2 %
Hematocrit: 40.6 % (ref 34.0–46.6)
Hemoglobin: 14.1 g/dL (ref 11.1–15.9)
Immature Grans (Abs): 0 10*3/uL (ref 0.0–0.1)
Immature Granulocytes: 0 %
Lymphocytes Absolute: 3.2 10*3/uL — ABNORMAL HIGH (ref 0.7–3.1)
Lymphs: 28 %
MCH: 31 pg (ref 26.6–33.0)
MCHC: 34.7 g/dL (ref 31.5–35.7)
MCV: 89 fL (ref 79–97)
Monocytes Absolute: 0.9 10*3/uL (ref 0.1–0.9)
Monocytes: 8 %
Neutrophils Absolute: 7 10*3/uL (ref 1.4–7.0)
Neutrophils: 62 %
Platelets: 405 10*3/uL (ref 150–450)
RBC: 4.55 x10E6/uL (ref 3.77–5.28)
RDW: 11.5 % — ABNORMAL LOW (ref 11.7–15.4)
WBC: 11.4 10*3/uL — ABNORMAL HIGH (ref 3.4–10.8)

## 2019-05-07 LAB — COMPREHENSIVE METABOLIC PANEL
ALT: 21 IU/L (ref 0–32)
AST: 22 IU/L (ref 0–40)
Albumin/Globulin Ratio: 1.9 (ref 1.2–2.2)
Albumin: 4.8 g/dL (ref 3.8–4.8)
Alkaline Phosphatase: 75 IU/L (ref 39–117)
BUN/Creatinine Ratio: 13 (ref 12–28)
BUN: 11 mg/dL (ref 8–27)
Bilirubin Total: 0.4 mg/dL (ref 0.0–1.2)
CO2: 29 mmol/L (ref 20–29)
Calcium: 10.4 mg/dL — ABNORMAL HIGH (ref 8.7–10.3)
Chloride: 97 mmol/L (ref 96–106)
Creatinine, Ser: 0.87 mg/dL (ref 0.57–1.00)
GFR calc Af Amer: 80 mL/min/{1.73_m2} (ref >59)
GFR calc non Af Amer: 69 mL/min/{1.73_m2} (ref >59)
Globulin, Total: 2.5 g/dL (ref 1.5–4.5)
Glucose: 108 mg/dL — ABNORMAL HIGH (ref 65–99)
Potassium: 4 mmol/L (ref 3.5–5.2)
Sodium: 140 mmol/L (ref 134–144)
Total Protein: 7.3 g/dL (ref 6.0–8.5)

## 2019-05-07 LAB — LIPID PANEL
Chol/HDL Ratio: 1.9 ratio (ref 0.0–4.4)
Cholesterol, Total: 185 mg/dL (ref 100–199)
HDL: 95 mg/dL (ref >39)
LDL Chol Calc (NIH): 79 mg/dL (ref 0–99)
Triglycerides: 60 mg/dL (ref 0–149)
VLDL Cholesterol Cal: 11 mg/dL (ref 5–40)

## 2019-05-07 LAB — HEMOGLOBIN A1C
Est. average glucose Bld gHb Est-mCnc: 120 mg/dL
Hgb A1c MFr Bld: 5.8 % — ABNORMAL HIGH (ref 4.8–5.6)

## 2019-05-07 LAB — T4, FREE: Free T4: 1.34 ng/dL (ref 0.82–1.77)

## 2019-05-07 LAB — TSH: TSH: 2.2 u[IU]/mL (ref 0.450–4.500)

## 2019-05-07 LAB — VITAMIN D 25 HYDROXY (VIT D DEFICIENCY, FRACTURES): Vit D, 25-Hydroxy: 60.2 ng/mL (ref 30.0–100.0)

## 2019-05-09 ENCOUNTER — Other Ambulatory Visit: Payer: Self-pay | Admitting: Family Medicine

## 2019-05-09 DIAGNOSIS — Z23 Encounter for immunization: Secondary | ICD-10-CM | POA: Diagnosis not present

## 2019-05-09 DIAGNOSIS — Z780 Asymptomatic menopausal state: Secondary | ICD-10-CM | POA: Diagnosis not present

## 2019-05-09 DIAGNOSIS — I10 Essential (primary) hypertension: Secondary | ICD-10-CM | POA: Diagnosis not present

## 2019-05-09 DIAGNOSIS — Z0000 Encounter for general adult medical examination without abnormal findings: Secondary | ICD-10-CM | POA: Diagnosis not present

## 2019-05-09 DIAGNOSIS — Z720 Tobacco use: Secondary | ICD-10-CM | POA: Diagnosis not present

## 2019-05-09 DIAGNOSIS — Z87891 Personal history of nicotine dependence: Secondary | ICD-10-CM | POA: Diagnosis not present

## 2019-05-09 DIAGNOSIS — E782 Mixed hyperlipidemia: Secondary | ICD-10-CM | POA: Diagnosis not present

## 2019-05-09 DIAGNOSIS — E559 Vitamin D deficiency, unspecified: Secondary | ICD-10-CM | POA: Diagnosis not present

## 2019-05-09 DIAGNOSIS — I169 Hypertensive crisis, unspecified: Secondary | ICD-10-CM | POA: Diagnosis not present

## 2019-05-09 MED ORDER — LORAZEPAM 0.5 MG PO TABS: ORAL_TABLET | ORAL | 0 refills | Status: DC

## 2019-05-09 NOTE — Telephone Encounter (Signed)
Patient says that at last OV she should have had a prescription for Ativan to be sent to [facility]. Please advise if this is the case.

## 2019-05-09 NOTE — Telephone Encounter (Signed)
Ordered pended and sent to Dr. Jenetta Downer. Please advise.  AS,  CMA

## 2019-05-09 NOTE — Addendum Note (Signed)
Addended by: Mickel Crow on: 05/09/2019 11:42 AM   Modules accepted: Orders

## 2019-05-09 NOTE — Telephone Encounter (Signed)
Since last office visit would not sign off on, I sent it during that office visit note.  Thank you

## 2019-05-23 ENCOUNTER — Encounter: Payer: Self-pay | Admitting: Family Medicine

## 2019-05-26 ENCOUNTER — Telehealth: Payer: Self-pay | Admitting: Family Medicine

## 2019-05-26 NOTE — Telephone Encounter (Signed)
I called patient insurance company at [phone] and spoke with representative Ar[employer]ist # [id number]) and was advised patient insurance does NOT require prior auth for this imaging CPT code G0297 with ICD Z72.0 . AS, CMA

## 2019-05-26 NOTE — Telephone Encounter (Signed)
-----   Message from Ramonita Lab sent at 05/23/2019  8:04 AM EST ----- Regarding: CT Lung Screen PA Patient is sch at [place] on 06/02/19 for a CT Lung Screen, need PA through her [facility] plan please

## 2019-06-02 ENCOUNTER — Ambulatory Visit
Admission: RE | Admit: 2019-06-02 | Discharge: 2019-06-02 | Disposition: A | Discharge status: Home / Self Care | Payer: Medicare HMO | Source: Ambulatory Visit | Attending: Family Medicine | Admitting: Family Medicine

## 2019-06-02 DIAGNOSIS — Z87891 Personal history of nicotine dependence: Secondary | ICD-10-CM | POA: Diagnosis not present

## 2019-06-02 DIAGNOSIS — Z720 Tobacco use: Secondary | ICD-10-CM

## 2019-06-17 DIAGNOSIS — Z20828 Contact with and (suspected) exposure to other viral communicable diseases: Secondary | ICD-10-CM | POA: Diagnosis not present

## 2019-06-17 DIAGNOSIS — R509 Fever, unspecified: Secondary | ICD-10-CM | POA: Diagnosis not present

## 2019-06-17 DIAGNOSIS — R5383 Other fatigue: Secondary | ICD-10-CM | POA: Diagnosis not present

## 2019-06-17 DIAGNOSIS — R438 Other disturbances of smell and taste: Secondary | ICD-10-CM | POA: Diagnosis not present

## 2019-06-18 ENCOUNTER — Telehealth: Payer: Self-pay | Admitting: Family Medicine

## 2019-06-18 NOTE — Telephone Encounter (Signed)
Patient states she has fever, vomiting, body aches and severe headache. I advised her to get COVID tested. Patient states she did a rapid covid test yesterday and was negative. I advised patient to go to UC to be seen and get tested for the flu since we have no openings for telehealth or doxy apt today. Patient states she would do that. AS, CMA

## 2019-06-18 NOTE — Telephone Encounter (Signed)
Patient was instructed this morning to go to urgent care. AS, CMA

## 2019-06-18 NOTE — Telephone Encounter (Signed)
Please advise patient that even if her rapid Covid was negative, these are very insensitive and not specific.  I had several patients that have a negative rapid but then test positive for the PCR.  Please let patient know this is what I recommend she obtain as I just had a patient last week in the situation.  Also, unless she has a negative PCR, she will need to go to urgent care for further care at this time.

## 2019-06-18 NOTE — Telephone Encounter (Incomplete)
Patient called states for the last 3  has Flu symptoms ? low grade fever 99.1 & body aches(no runny nose or congestion).  --Pt seeking any advice other than to rest & drink plenty fluids call her @ [phone]  --glh

## 2019-07-21 ENCOUNTER — Other Ambulatory Visit: Payer: Self-pay | Admitting: Family Medicine

## 2019-07-21 DIAGNOSIS — Z1231 Encounter for screening mammogram for malignant neoplasm of breast: Secondary | ICD-10-CM

## 2019-08-09 ENCOUNTER — Ambulatory Visit: Payer: Medicare HMO | Attending: Internal Medicine

## 2019-08-09 DIAGNOSIS — Z23 Encounter for immunization: Secondary | ICD-10-CM

## 2019-08-09 NOTE — Progress Notes (Signed)
   Covid-19 Vaccination Clinic  Name:  Wendy Wheeler    MRN: [id number] DOB: 01-19-1951  08/09/2019  [name] was observed post Covid-19 immunization for 15 minutes without incidence. She was provided with Vaccine Information Sheet and instruction to access the V-Safe system.   [name] was instructed to call 911 with any severe reactions post vaccine: Marland Kitchen Difficulty breathing  . Swelling of your face and throat  . A fast heartbeat  . A bad rash all over your body  . Dizziness and weakness    Immunizations Administered    Name Date Dose VIS Date Route   Pfizer COVID-19 Vaccine 08/09/2019  2:53 PM 0.3 mL 05/30/2019 Intramuscular   Manufacturer: [place]   Lot: [id number]   [place]: [id number]

## 2019-08-29 ENCOUNTER — Other Ambulatory Visit: Payer: Self-pay

## 2019-08-29 ENCOUNTER — Ambulatory Visit
Admission: RE | Admit: 2019-08-29 | Discharge: 2019-08-29 | Disposition: A | Discharge status: Home / Self Care | Payer: Medicare HMO | Source: Ambulatory Visit | Attending: Family Medicine | Admitting: Family Medicine

## 2019-08-29 DIAGNOSIS — Z1231 Encounter for screening mammogram for malignant neoplasm of breast: Secondary | ICD-10-CM | POA: Diagnosis not present

## 2019-09-01 ENCOUNTER — Other Ambulatory Visit: Payer: Self-pay | Admitting: Family Medicine

## 2019-09-01 MED ORDER — HYDROCHLOROTHIAZIDE 25 MG PO TABS: 25.0000 mg | ORAL_TABLET | Freq: Every day | ORAL | 1 refills | Status: DC

## 2019-09-01 NOTE — Telephone Encounter (Signed)
Pt called requested refill on :   hydrochlorothiazide (HYDRODIURIL) 25 MG tablet [id number]   Order Details Dose: 25 mg Route: Oral Frequency: Daily  Dispense Quantity: 90 tablet Refills: 1       Sig: Take 1 tablet (25 mg total) by mouth daily.       --Forwarding request to med asst that if approved send refill order to :  [place], [place] [phone] (Phone) [phone] (Fax)   -glh

## 2019-09-01 NOTE — Telephone Encounter (Signed)
Refill sent to pharmacy. AS, CMA 

## 2019-09-02 ENCOUNTER — Ambulatory Visit: Payer: Medicare HMO | Attending: Internal Medicine

## 2019-09-02 DIAGNOSIS — Z23 Encounter for immunization: Secondary | ICD-10-CM

## 2019-09-02 NOTE — Progress Notes (Signed)
   Covid-19 Vaccination Clinic  Name:  Wendy Wheeler    MRN: [id number] DOB: June 23, 1950  09/02/2019  [name] was observed post Covid-19 immunization for 15 minutes without incident. She was provided with Vaccine Information Sheet and instruction to access the V-Safe system.   [name] was instructed to call 911 with any severe reactions post vaccine: Marland Kitchen Difficulty breathing  . Swelling of face and throat  . A fast heartbeat  . A bad rash all over body  . Dizziness and weakness   Immunizations Administered    Name Date Dose VIS Date Route   Pfizer COVID-19 Vaccine 09/02/2019  1:54 PM 0.3 mL 05/30/2019 Intramuscular   Manufacturer: [place]   Lot: [id number]   [place]: [id number]

## 2019-09-03 ENCOUNTER — Ambulatory Visit: Payer: Medicare HMO | Admitting: Family Medicine

## 2019-09-11 DIAGNOSIS — Z1211 Encounter for screening for malignant neoplasm of colon: Secondary | ICD-10-CM | POA: Diagnosis not present

## 2019-09-12 ENCOUNTER — Other Ambulatory Visit: Payer: Self-pay | Admitting: Family Medicine

## 2019-09-12 DIAGNOSIS — I10 Essential (primary) hypertension: Secondary | ICD-10-CM

## 2019-09-12 LAB — COLOGUARD: Cologuard: NEGATIVE

## 2019-09-16 LAB — COLOGUARD: COLOGUARD: NEGATIVE

## 2019-09-23 ENCOUNTER — Telehealth: Payer: Self-pay

## 2019-09-23 NOTE — Telephone Encounter (Signed)
Pt informed of results of Cologuard.  Pt expressed understanding.  Charyl Bigger, CMA

## 2019-09-24 ENCOUNTER — Ambulatory Visit (INDEPENDENT_AMBULATORY_CARE_PROVIDER_SITE_OTHER): Payer: Medicare HMO | Admitting: Family Medicine

## 2019-09-24 ENCOUNTER — Encounter: Payer: Self-pay | Admitting: Family Medicine

## 2019-09-24 ENCOUNTER — Other Ambulatory Visit: Payer: Self-pay

## 2019-09-24 VITALS — BP 170/84 | HR 102 | Temp 97.8°F | Resp 10 | Ht <= 58 in | Wt 117.3 lb

## 2019-09-24 DIAGNOSIS — E782 Mixed hyperlipidemia: Secondary | ICD-10-CM | POA: Diagnosis not present

## 2019-09-24 DIAGNOSIS — F430 Acute stress reaction: Secondary | ICD-10-CM

## 2019-09-24 DIAGNOSIS — R7302 Impaired glucose tolerance (oral): Secondary | ICD-10-CM | POA: Diagnosis not present

## 2019-09-24 DIAGNOSIS — Z87891 Personal history of nicotine dependence: Secondary | ICD-10-CM

## 2019-09-24 DIAGNOSIS — I10 Essential (primary) hypertension: Secondary | ICD-10-CM | POA: Diagnosis not present

## 2019-09-24 DIAGNOSIS — R911 Solitary pulmonary nodule: Secondary | ICD-10-CM | POA: Diagnosis not present

## 2019-09-24 DIAGNOSIS — E559 Vitamin D deficiency, unspecified: Secondary | ICD-10-CM

## 2019-09-24 DIAGNOSIS — J432 Centrilobular emphysema: Secondary | ICD-10-CM | POA: Diagnosis not present

## 2019-09-24 LAB — POCT GLYCOSYLATED HEMOGLOBIN (HGB A1C): Hemoglobin A1C: 5.8 % — AB (ref 4.0–5.6)

## 2019-09-24 NOTE — Patient Instructions (Signed)
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
## 2019-09-29 DIAGNOSIS — R69 Illness, unspecified: Secondary | ICD-10-CM | POA: Diagnosis not present

## 2019-10-08 DIAGNOSIS — M542 Cervicalgia: Secondary | ICD-10-CM | POA: Diagnosis not present

## 2019-10-31 ENCOUNTER — Other Ambulatory Visit: Payer: Self-pay

## 2019-10-31 ENCOUNTER — Encounter: Payer: Self-pay | Admitting: Internal Medicine

## 2019-10-31 ENCOUNTER — Ambulatory Visit: Payer: Medicare HMO | Admitting: Internal Medicine

## 2019-10-31 VITALS — BP 150/95 | HR 70 | Temp 95.4°F | Ht <= 58 in | Wt 117.4 lb

## 2019-10-31 DIAGNOSIS — E782 Mixed hyperlipidemia: Secondary | ICD-10-CM | POA: Diagnosis not present

## 2019-10-31 DIAGNOSIS — I2584 Coronary atherosclerosis due to calcified coronary lesion: Secondary | ICD-10-CM

## 2019-10-31 DIAGNOSIS — I10 Essential (primary) hypertension: Secondary | ICD-10-CM

## 2019-10-31 DIAGNOSIS — I2510 Atherosclerotic heart disease of native coronary artery without angina pectoris: Secondary | ICD-10-CM

## 2019-10-31 NOTE — Patient Instructions (Signed)

## 2019-10-31 NOTE — Progress Notes (Signed)
OFFICE CONSULT NOTE  Chief Complaint:  Routine follow-up  Primary Care Physician: Lorrene Reid, PA-C  HPI:  Wendy Wheeler is a 69 y.o. female who is being seen today for the evaluation of coronary artery calcification at the request of Mellody Dance, DO.  This is a pleasant 69 year old female with a history of long-standing tobacco abuse, who stopped smoking 2010.  She recently established her care with Dr. Raliegh Scarlet.  She also has a history of hypertension and dyslipidemia.  She underwent a screening CT scan to look for any possible lung malignancy, and was found to have calcification of the LAD.  She reports being asymptomatic, denying any chest pain.  She does get short of breath if she goes up and down the stairs more than 10 times.  She is very physically active.  She has treated hypertension and recently was noted to have dyslipidemia.  Her LDL was 112.  She was placed on atorvastatin 20 mg daily and is tolerating that well.  Her LDL most recently was 81.  Her goal LDL is less than 70.  We discussed diet today.  She eats a somewhat atherogenic diet.  She eats a lot of red meat and is recently switched from fried foods more to baking.  She is tended to use vegetable oil but is moving towards all of oil and canola oil.  She is also worked on reducing other sources of saturated fats in her diet.  03/05/2018  Wendy Wheeler is seen today in follow-up.  Overall she continues to do well.  She denies chest pain or worsening shortness of breath.  After making significant dietary changes and please report her cholesterol profile is improved.  Total cholesterol now 178, HDL 42, triglycerides 89 and LDL of 81.  This is close to goal LDL less than 70.  She remains on low-dose atorvastatin 20 mg daily.  She denies any chest pain or worsening shortness of breath.  Blood pressure was elevated today 174/84 and may need to be followed closely.  She has not done a lot of physical activity, having concerned  about her abnormal calcium score.  I reassured her that she could do exertion and should monitor for any symptoms with exercise.  10/31/2019  Wendy Wheeler is seen today for follow-up.  Overall she is without new complaints.  Recent lipids were total cholesterol 185, triglycerides 60, HDL 95 and LDL 79.  She is on atorvastatin 40 and LDL now is much better controlled.  I encouraged her to continue work on diet and exercise.  She reports not being a smoker although did some in the past.  She is noted to be wheezy today.  She thinks it might be related to allergies which have been worse since she has been in [place].  PMHx:  Past Medical History:  Diagnosis Date  . Hyperlipidemia   . Hypertension     No past surgical history on file.  FAMHx:  Family History  Problem Relation Age of Onset  . Cancer Mother        breast  . Hyperlipidemia Mother   . Hypertension Mother   . Hypertension Father   . Hyperlipidemia Father   . Depression Father        suicide  . Cancer Sister        bladder  . Heart disease Paternal Grandmother   . Stroke Paternal Grandfather   . Bladder Cancer Sister   . Diabetes Brother   . Cerebral palsy  Brother     SOCHx:   reports that she has never smoked. She has never used smokeless tobacco. She reports that she does not drink alcohol or use drugs.  ALLERGIES:  Allergies  Allergen Reactions  . Aleve [Naproxen Sodium] Swelling    ROS: Pertinent items noted in HPI and remainder of comprehensive ROS otherwise negative.  HOME MEDS: Current Outpatient Medications on File Prior to Visit  Medication Sig Dispense Refill  . Ascorbic Acid (VITAMIN C) 1000 MG tablet Take 1,000 mg by mouth daily.    Marland Kitchen aspirin EC 81 MG tablet Take 81 mg by mouth daily.    Marland Kitchen atorvastatin (LIPITOR) 40 MG tablet Take 1 tablet (40 mg total) by mouth at bedtime. 90 tablet 3  . cholecalciferol (VITAMIN D) 1000 units tablet Take 1,000 Units by mouth daily. Takes 3 times daily- 3,000  QD    . hydrochlorothiazide (HYDRODIURIL) 25 MG tablet Take 1 tablet (25 mg total) by mouth daily. 90 tablet 1  . LORazepam (ATIVAN) 0.5 MG tablet 1/2 to 1 tablet by mouth prn panic attacks/ intense anxiety 30 tablet 0  . Menaquinone-7 (VITAMIN K2 PO) Take 1 tablet by mouth daily.    . metoprolol succinate (TOPROL-XL) 25 MG 24 hr tablet TAKE 1 TABLET EVERY DAY 30 tablet 1  . Multiple Vitamins-Minerals (HAIR SKIN AND NAILS FORMULA PO) Take 1 tablet by mouth daily.    . vitamin B-12 (CYANOCOBALAMIN) 1000 MCG tablet Take 1,000 mcg by mouth daily.     No current facility-administered medications on file prior to visit.    LABS/IMAGING: No results found for this or any previous visit (from the past 48 hour(s)). No results found.  LIPID PANEL:    Component Value Date/Time   CHOL 185 05/06/2019 0908   TRIG 60 05/06/2019 0908   HDL 95 05/06/2019 0908   CHOLHDL 1.9 05/06/2019 0908   LDLCALC 79 05/06/2019 0908    WEIGHTS: Wt Readings from Last 3 Encounters:  10/31/19 117 lb 6.4 oz (53.3 kg)  09/24/19 117 lb 4.8 oz (53.2 kg)  05/06/19 115 lb 12.8 oz (52.5 kg)    VITALS: BP (!) 150/95   Pulse 70   Temp (!) 95.4 F (35.2 C)   Ht 4\' 10"  (1.473 m)   Wt 117 lb 6.4 oz (53.3 kg)   SpO2 96%   BMI 24.54 kg/m   EXAM: General appearance: alert and no distress Neck: no carotid bruit, no JVD and thyroid not enlarged, symmetric, no tenderness/mass/nodules Lungs: wheezes bilaterally Heart: regular rate and rhythm Abdomen: soft, non-tender; bowel sounds normal; no masses,  no organomegaly and scaphoid Extremities: extremities normal, atraumatic, no cyanosis or edema Pulses: 2+ and symmetric Skin: Skin color, texture, turgor normal. No rashes or lesions Neurologic: Grossly normal Psych: Pleasant  EKG: Normal sinus rhythm at 70-personally reviewed  ASSESSMENT: 1. Single-vessel coronary artery calcification of the LAD 2. History of tobacco abuse -wheezing, question chronic bronchitis    3. Hypertension 4. Dyslipidemia  PLAN: 1.   Wendy Wheeler denies any chest pain or worsening shortness of breath.  She is noted to be wheezy today, I question whether she could have some chronic bronchitis although she feels it might be allergies.  There is some mucus in she did cough when she left noting that there is some reactive airway disease.  Blood pressures well controlled.  Her cholesterol very near goal LDL less than 70.  I encouraged continued diet and exercise.  She should follow-up with her PCP regarding  the wheeze.  Pixie Casino, MD, [facility], [place] Director of the [facility] Lipid Disorders &  Cardiovascular Risk Reduction Clinic Diplomate of the American Board of Clinical Lipidology Attending Cardiologist  Direct Dial: [phone]  Fax: [phone]  Website:  [email or web address] 10/31/2019, 1:46 PM

## 2019-11-01 ENCOUNTER — Other Ambulatory Visit: Payer: Self-pay | Admitting: Family Medicine

## 2019-11-01 DIAGNOSIS — I10 Essential (primary) hypertension: Secondary | ICD-10-CM

## 2019-11-14 DIAGNOSIS — H52223 Regular astigmatism, bilateral: Secondary | ICD-10-CM | POA: Diagnosis not present

## 2019-11-14 DIAGNOSIS — H524 Presbyopia: Secondary | ICD-10-CM | POA: Diagnosis not present

## 2019-11-14 DIAGNOSIS — H43813 Vitreous degeneration, bilateral: Secondary | ICD-10-CM | POA: Diagnosis not present

## 2019-11-14 DIAGNOSIS — H2513 Age-related nuclear cataract, bilateral: Secondary | ICD-10-CM | POA: Diagnosis not present

## 2019-11-14 DIAGNOSIS — H5213 Myopia, bilateral: Secondary | ICD-10-CM | POA: Diagnosis not present

## 2019-11-19 ENCOUNTER — Telehealth: Payer: Self-pay | Admitting: Physician Assistant

## 2019-11-19 NOTE — Telephone Encounter (Signed)
Patient last seen 09/24/19 and advised to follow up in 4 months.   Last refill for Lorazepam given 05/09/19 #30 no refill.   Please approve if deemed appropriate. AS, CMA

## 2019-11-19 NOTE — Telephone Encounter (Signed)
Patient came by office states Pharmacy advised her to come to Dr's office.  Forwarding request to med asst for refill on :   LORazepam (ATIVAN) 0.5 MG tablet [id number]   Order Details Dose, Route, Frequency: As Directed  Dispense Quantity: 30 tablet Refills: 0       Sig: 1/2 to 1 tablet by mouth prn panic attacks/ intense anxiety   ---Pt uses :   [place], [place] - [place] [phone] (Phone) [phone] (Fax)   --please contact pt if there are any questions concerns   --glh

## 2019-11-20 ENCOUNTER — Other Ambulatory Visit: Payer: Self-pay | Admitting: Physician Assistant

## 2019-11-20 DIAGNOSIS — F430 Acute stress reaction: Secondary | ICD-10-CM

## 2019-11-20 MED ORDER — LORAZEPAM 0.5 MG PO TABS: ORAL_TABLET | ORAL | 0 refills | Status: AC

## 2019-12-30 ENCOUNTER — Telehealth: Payer: Self-pay | Admitting: Physician Assistant

## 2019-12-30 DIAGNOSIS — I10 Essential (primary) hypertension: Secondary | ICD-10-CM

## 2019-12-30 MED ORDER — METOPROLOL SUCCINATE ER 25 MG PO TB24: 25.0000 mg | ORAL_TABLET | Freq: Every day | ORAL | 0 refills | Status: DC

## 2019-12-30 NOTE — Telephone Encounter (Signed)
Patient is requesting a refill of her metoprolol and hydrochlorothiazide , if approved please send to [facility]

## 2019-12-30 NOTE — Addendum Note (Signed)
Addended by: Fonnie Mu on: 12/30/2019 02:57 PM   Modules accepted: Orders

## 2019-12-30 NOTE — Telephone Encounter (Signed)
Pt informed that it is too early for refills on HCTZ.  Charyl Bigger, CMA

## 2020-01-01 DIAGNOSIS — H5213 Myopia, bilateral: Secondary | ICD-10-CM | POA: Diagnosis not present

## 2020-01-30 ENCOUNTER — Encounter: Payer: Self-pay | Admitting: Physician Assistant

## 2020-01-30 ENCOUNTER — Other Ambulatory Visit: Payer: Self-pay

## 2020-01-30 ENCOUNTER — Ambulatory Visit (INDEPENDENT_AMBULATORY_CARE_PROVIDER_SITE_OTHER): Payer: Medicare HMO | Admitting: Physician Assistant

## 2020-01-30 VITALS — BP 123/80 | HR 80 | Temp 96.5°F | Ht <= 58 in | Wt 117.2 lb

## 2020-01-30 DIAGNOSIS — I2510 Atherosclerotic heart disease of native coronary artery without angina pectoris: Secondary | ICD-10-CM

## 2020-01-30 DIAGNOSIS — I10 Essential (primary) hypertension: Secondary | ICD-10-CM | POA: Diagnosis not present

## 2020-01-30 DIAGNOSIS — I2584 Coronary atherosclerosis due to calcified coronary lesion: Secondary | ICD-10-CM | POA: Diagnosis not present

## 2020-01-30 DIAGNOSIS — F430 Acute stress reaction: Secondary | ICD-10-CM

## 2020-01-30 DIAGNOSIS — R7302 Impaired glucose tolerance (oral): Secondary | ICD-10-CM | POA: Diagnosis not present

## 2020-01-30 DIAGNOSIS — E782 Mixed hyperlipidemia: Secondary | ICD-10-CM | POA: Diagnosis not present

## 2020-01-30 MED ORDER — HYDROCHLOROTHIAZIDE 25 MG PO TABS: 25.0000 mg | ORAL_TABLET | Freq: Every day | ORAL | 1 refills | Status: DC
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
## 2020-03-12 ENCOUNTER — Other Ambulatory Visit: Payer: Self-pay | Admitting: Physician Assistant

## 2020-03-12 DIAGNOSIS — E782 Mixed hyperlipidemia: Secondary | ICD-10-CM

## 2020-03-12 MED ORDER — ATORVASTATIN CALCIUM 40 MG PO TABS: 40.0000 mg | ORAL_TABLET | Freq: Every day | ORAL | 0 refills | Status: DC

## 2020-03-16 ENCOUNTER — Other Ambulatory Visit: Payer: Self-pay | Admitting: Physician Assistant

## 2020-03-16 DIAGNOSIS — E782 Mixed hyperlipidemia: Secondary | ICD-10-CM

## 2020-03-16 MED ORDER — ATORVASTATIN CALCIUM 40 MG PO TABS: 40.0000 mg | ORAL_TABLET | Freq: Every day | ORAL | 0 refills | Status: DC

## 2020-03-31 ENCOUNTER — Other Ambulatory Visit: Payer: Self-pay | Admitting: Physician Assistant

## 2020-03-31 DIAGNOSIS — I10 Essential (primary) hypertension: Secondary | ICD-10-CM

## 2020-04-23 ENCOUNTER — Other Ambulatory Visit: Payer: Medicare HMO

## 2020-04-23 ENCOUNTER — Other Ambulatory Visit: Payer: Self-pay

## 2020-04-23 DIAGNOSIS — E559 Vitamin D deficiency, unspecified: Secondary | ICD-10-CM | POA: Diagnosis not present

## 2020-04-23 DIAGNOSIS — E7889 Other lipoprotein metabolism disorders: Secondary | ICD-10-CM | POA: Diagnosis not present

## 2020-04-23 DIAGNOSIS — E782 Mixed hyperlipidemia: Secondary | ICD-10-CM

## 2020-04-23 DIAGNOSIS — R7302 Impaired glucose tolerance (oral): Secondary | ICD-10-CM

## 2020-04-23 DIAGNOSIS — Z79899 Other long term (current) drug therapy: Secondary | ICD-10-CM

## 2020-04-23 DIAGNOSIS — I10 Essential (primary) hypertension: Secondary | ICD-10-CM

## 2020-04-24 LAB — COMPREHENSIVE METABOLIC PANEL
ALT: 19 IU/L (ref 0–32)
AST: 22 IU/L (ref 0–40)
Albumin/Globulin Ratio: 1.7 (ref 1.2–2.2)
Albumin: 4.4 g/dL (ref 3.8–4.8)
Alkaline Phosphatase: 71 IU/L (ref 44–121)
BUN/Creatinine Ratio: 16 (ref 12–28)
BUN: 15 mg/dL (ref 8–27)
Bilirubin Total: 0.6 mg/dL (ref 0.0–1.2)
CO2: 25 mmol/L (ref 20–29)
Calcium: 10 mg/dL (ref 8.7–10.3)
Chloride: 96 mmol/L (ref 96–106)
Creatinine, Ser: 0.96 mg/dL (ref 0.57–1.00)
GFR calc Af Amer: 70 mL/min/{1.73_m2} (ref >59)
GFR calc non Af Amer: 61 mL/min/{1.73_m2} (ref >59)
Globulin, Total: 2.6 g/dL (ref 1.5–4.5)
Glucose: 103 mg/dL — ABNORMAL HIGH (ref 65–99)
Potassium: 3.4 mmol/L — ABNORMAL LOW (ref 3.5–5.2)
Sodium: 137 mmol/L (ref 134–144)
Total Protein: 7 g/dL (ref 6.0–8.5)

## 2020-04-24 LAB — CBC WITH DIFFERENTIAL/PLATELET
Basophils Absolute: 0.1 10*3/uL (ref 0.0–0.2)
Basos: 1 %
EOS (ABSOLUTE): 0.2 10*3/uL (ref 0.0–0.4)
Eos: 2 %
Hematocrit: 41 % (ref 34.0–46.6)
Hemoglobin: 14 g/dL (ref 11.1–15.9)
Immature Grans (Abs): 0 10*3/uL (ref 0.0–0.1)
Immature Granulocytes: 0 %
Lymphocytes Absolute: 2.8 10*3/uL (ref 0.7–3.1)
Lymphs: 32 %
MCH: 30.4 pg (ref 26.6–33.0)
MCHC: 34.1 g/dL (ref 31.5–35.7)
MCV: 89 fL (ref 79–97)
Monocytes Absolute: 0.9 10*3/uL (ref 0.1–0.9)
Monocytes: 11 %
Neutrophils Absolute: 4.7 10*3/uL (ref 1.4–7.0)
Neutrophils: 54 %
Platelets: 403 10*3/uL (ref 150–450)
RBC: 4.6 x10E6/uL (ref 3.77–5.28)
RDW: 11.5 % — ABNORMAL LOW (ref 11.7–15.4)
WBC: 8.7 10*3/uL (ref 3.4–10.8)

## 2020-04-24 LAB — TSH: TSH: 1.45 u[IU]/mL (ref 0.450–4.500)

## 2020-04-24 LAB — HEMOGLOBIN A1C
Est. average glucose Bld gHb Est-mCnc: 120 mg/dL
Hgb A1c MFr Bld: 5.8 % — ABNORMAL HIGH (ref 4.8–5.6)

## 2020-04-24 LAB — LIPID PANEL
Chol/HDL Ratio: 2 ratio (ref 0.0–4.4)
Cholesterol, Total: 183 mg/dL (ref 100–199)
HDL: 92 mg/dL (ref >39)
LDL Chol Calc (NIH): 80 mg/dL (ref 0–99)
Triglycerides: 58 mg/dL (ref 0–149)
VLDL Cholesterol Cal: 11 mg/dL (ref 5–40)

## 2020-04-24 LAB — VITAMIN D 25 HYDROXY (VIT D DEFICIENCY, FRACTURES): Vit D, 25-Hydroxy: 50.3 ng/mL (ref 30.0–100.0)

## 2020-04-26 ENCOUNTER — Encounter: Payer: Self-pay | Admitting: Physician Assistant

## 2020-04-26 ENCOUNTER — Ambulatory Visit (INDEPENDENT_AMBULATORY_CARE_PROVIDER_SITE_OTHER): Payer: Medicare HMO | Admitting: Physician Assistant

## 2020-04-26 ENCOUNTER — Other Ambulatory Visit: Payer: Self-pay

## 2020-04-26 VITALS — BP 137/85 | HR 73 | Temp 98.0°F | Ht <= 58 in | Wt 115.0 lb

## 2020-04-26 DIAGNOSIS — Z0000 Encounter for general adult medical examination without abnormal findings: Secondary | ICD-10-CM | POA: Diagnosis not present

## 2020-04-26 DIAGNOSIS — I10 Essential (primary) hypertension: Secondary | ICD-10-CM | POA: Diagnosis not present

## 2020-04-26 DIAGNOSIS — E876 Hypokalemia: Secondary | ICD-10-CM

## 2020-04-26 MED ORDER — POTASSIUM CHLORIDE ER 10 MEQ PO TBCR: 10.0000 meq | EXTENDED_RELEASE_TABLET | Freq: Every day | ORAL | 0 refills | Status: DC

## 2020-04-26 NOTE — Progress Notes (Signed)
Virtual Visit via Telephone Note:  I connected with Wendy Wheeler. [place] by telephone and verified that I am speaking with the correct person using two identifiers.    I discussed the limitations, risks, security and privacy concerns for performing an evaluation and management service by telephone and the availability of in person appointments. The staff discussed with patient that there may be a patient responsible charge related to this service. The patient expressed understanding and agreed to proceed.   Location of Patient- Home Location of Provider- Office    Subjective:   Wendy Wheeler is a 69 y.o. female who presents for Medicare Annual (Subsequent) preventive examination.  Review of Systems    General:   No F/C, wt loss Pulm:   No DIB, SOB, pleuritic chest pain Card:  No CP, palpitations Abd:  No n/v/d or pain Ext:  No inc edema from baseline   Objective:    Today's Vitals   04/26/20 1135  BP: 137/85  Pulse: 73  Temp: 98 F (36.7 C)  Weight: 115 lb (52.2 kg)  Height: 4\' 10"  (1.473 m)   Body mass index is 24.04 kg/m.  Advanced Directives 01/17/2017  Does Patient Have a Medical Advance Directive? No  Would patient like information on creating a medical advance directive? No - Patient declined    Current Medications (verified) Outpatient Encounter Medications as of 04/26/2020  Medication Sig   Ascorbic Acid (VITAMIN C) 1000 MG tablet Take 1,000 mg by mouth daily.   aspirin EC 81 MG tablet Take 81 mg by mouth daily.   atorvastatin (LIPITOR) 40 MG tablet Take 1 tablet (40 mg total) by mouth at bedtime.   cholecalciferol (VITAMIN D) 1000 units tablet Take 1,000 Units by mouth daily. Takes 3 times daily- 3,000 QD   hydrochlorothiazide (HYDRODIURIL) 25 MG tablet Take 1 tablet (25 mg total) by mouth daily.   LORazepam (ATIVAN) 0.5 MG tablet 1/2 to 1 tablet by mouth prn panic attacks/ intense anxiety   Menaquinone-7 (VITAMIN K2 PO) Take 1 tablet by mouth daily.    metoprolol succinate (TOPROL-XL) 25 MG 24 hr tablet Take 1 tablet (25 mg total) by mouth daily. **NEEDS APT FOR FURTHER REFILLS**   Multiple Vitamins-Minerals (HAIR SKIN AND NAILS FORMULA PO) Take 1 tablet by mouth daily.   vitamin B-12 (CYANOCOBALAMIN) 1000 MCG tablet Take 1,000 mcg by mouth daily.   No facility-administered encounter medications on file as of 04/26/2020.    Allergies (verified) Aleve [naproxen sodium]   History: Past Medical History:  Diagnosis Date   Hyperlipidemia    Hypertension    History reviewed. No pertinent surgical history. Family History  Problem Relation Age of Onset   Cancer Mother        breast   Hyperlipidemia Mother    Hypertension Mother    Hypertension Father    Hyperlipidemia Father    Depression Father        suicide   Cancer Sister        bladder   Heart disease Paternal Grandmother    Stroke Paternal Grandfather    Bladder Cancer Sister    Diabetes Brother    Cerebral palsy Brother    Social History   Socioeconomic History   Marital status: Married    Spouse name: Not on file   Number of children: Not on file   Years of education: Not on file   Highest education level: Not on file  Occupational History   Not on file  Tobacco Use  Smoking status: Never Smoker   Smokeless tobacco: Never Used  Vaping Use   Vaping Use: Never used  Substance and Sexual Activity   Alcohol use: No   Drug use: No   Sexual activity: Yes    Partners: Male  Other Topics Concern   Not on file  Social History Narrative   Not on file   Social Determinants of Health   Financial Resource Strain:    Difficulty of Paying Living Expenses: Not on file  Food Insecurity:    Worried About [place] in the Last Year: Not on file   [employer] of Food in the Last Year: Not on file  Transportation Needs:    Lack of Transportation (Medical): Not on file   Lack of Transportation (Non-Medical): Not on file   Physical Activity:    Days of Exercise per Week: Not on file   Minutes of Exercise per Session: Not on file  Stress:    Feeling of Stress : Not on file  Social Connections:    Frequency of Communication with Friends and Family: Not on file   Frequency of Social Gatherings with Friends and Family: Not on file   Attends Religious Services: Not on file   Active Member of Clubs or Organizations: Not on file   Attends Archivist Meetings: Not on file   Marital Status: Not on file    Tobacco Counseling Counseling given: Not Answered   Diabetic?No         Activities of Daily Living In your present state of health, do you have any difficulty performing the following activities: 04/26/2020 01/30/2020  Hearing? N N  Vision? N N  Difficulty concentrating or making decisions? N Y  Walking or climbing stairs? N N  Dressing or bathing? N N  Doing errands, shopping? N N  Some recent data might be hidden    Patient Care Team: Lorrene Reid, PA-C as PCP - General Debara Pickett Nadean Corwin, MD as PCP - Cardiology (Cardiology) [place] any recent Medical Services you may have received from other than [facility] providers in the past year (date may be approximate).     Assessment:   This is a routine wellness examination for Wendy Wheeler.  Hearing/Vision screen No exam data present  Dietary issues and exercise activities discussed: -Follow a heart healthy diet and stay as active as possible.  Goals   None    Depression Screen PHQ 2/9 Scores 04/26/2020 01/30/2020 09/24/2019 05/06/2019 11/12/2018 07/22/2018 05/20/2018  PHQ - 2 Score 0 0 0 0 0 0 0  PHQ- 9 Score 2 2 2 1 1 2 2     Fall Risk Fall Risk  04/26/2020 01/30/2020 05/06/2019 07/22/2018 04/02/2018  Falls in the past year? 0 0 0 0 No  Number falls in past yr: - - 0 - -  Injury with Fall? - - 0 - -  Follow up Falls evaluation completed Falls evaluation completed Falls evaluation completed Falls  evaluation completed -    Any stairs in or around the home? Yes  If so, are there any without handrails? Yes  Home free of loose throw rugs in walkways, pet beds, electrical cords, etc? Yes  Adequate lighting in your home to reduce risk of falls? Yes   ASSISTIVE DEVICES UTILIZED TO PREVENT FALLS:  Life alert? No  Use of a cane, walker or w/c? No  Grab bars in the bathroom? No  Shower chair or bench in shower? No  Elevated toilet seat or a handicapped toilet? No   TIMED UP AND GO:  Was the test performed? No .  TELEHEALTH  Cognitive Function: wnl     6CIT Screen 04/26/2020 04/02/2018  What Year? 0 points 0 points  What month? 0 points 0 points  What time? 0 points 0 points  Count back from 20 0 points 0 points  Months in reverse 0 points 0 points  Repeat phrase 0 points 4 points  Total Score 0 4    Immunizations Immunization History  Administered Date(s) Administered   Fluad Quad(high Dose 65+) 05/09/2019   Influenza, High Dose Seasonal PF 03/20/2017   Influenza-Unspecified 05/27/2015, 05/31/2016   PFIZER SARS-COV-2 Vaccination 08/09/2019, 09/02/2019    TDAP status: Up to date Flu Vaccine status: Declined, Education has been provided regarding the importance of this vaccine but patient still declined. Advised may receive this vaccine at local pharmacy or Health Dept. Aware to provide a copy of the vaccination record if obtained from local pharmacy or Health Dept. Verbalized acceptance and understanding. Pneumococcal vaccine status: Declined,  Education has been provided regarding the importance of this vaccine but patient still declined. Advised may receive this vaccine at local pharmacy or Health Dept. Aware to provide a copy of the vaccination record if obtained from local pharmacy or Health Dept. Verbalized acceptance and understanding.  Covid-19 vaccine status: Completed vaccines  Qualifies for Shingles Vaccine? Yes   Zostavax completed No   Shingrix  Completed?: No.    Education has been provided regarding the importance of this vaccine. Patient has been advised to call insurance company to determine out of pocket expense if they have not yet received this vaccine. Advised may also receive vaccine at local pharmacy or Health Dept. Verbalized acceptance and understanding.  Screening Tests Health Maintenance  Topic Date Due   TETANUS/TDAP  Never Wheeler   PNA vac Low Risk Adult (1 of 2 - PCV13) Never Wheeler   INFLUENZA VACCINE  01/18/2020   MAMMOGRAM  08/28/2021   Fecal DNA (Cologuard)  09/11/2022   DEXA SCAN  Completed   COVID-19 Vaccine  Completed   Hepatitis C Screening  Completed    Health Maintenance  Health Maintenance Due  Topic Date Due   TETANUS/TDAP  Never Wheeler   PNA vac Low Risk Adult (1 of 2 - PCV13) Never Wheeler   INFLUENZA VACCINE  01/18/2020    Colonoscopy: Patient states she has had cologuard and declines colonoscopy Mammogram status: Completed 08/29/2019. Repeat every year Bone Density status: Completed 05/08/2018. Results reflect: Bone density results: OSTEOPOROSIS. Repeat every 3 years.  Lung Cancer Screening: (Low Dose CT Chest recommended if Age 69-80 years, 30 pack-year currently smoking OR have quit w/in 15years.) does not qualify.    Lung Cancer Screening Referral:  Additional Screening:  Hepatitis C Screening: does qualify; Completed 05/03/2018  Vision Screening: Recommended annual ophthalmology exams for early detection of glaucoma and other disorders of the eye. Is the patient up to date with their annual eye exam?  Yes  Who is the provider or what is the name of the office in which the patient attends annual eye exams? [facility] [place]  If pt is not established with a provider, would they like to be referred to a provider to establish care? No .   Dental Screening: Recommended annual dental exams for proper oral hygiene  Community Resource Referral / Chronic Care Management: CRR  required this visit?  No   CCM required this visit?  No     Plan:  -Most labs are essentially within normal limits or stable from prior.  Potassium is mildly low at 3.4, no prior history of hypokalemia, most likely related to diuretic therapy.  Will send prescription for potassium chloride 10 mEq to take for 2 days. Advised to return for lab visit to recheck potassium level. If potassium level continues to be consistently low, then will consider starting potassium chloride on a daily regimen. -Continue current medication regimen. -Continue ambulatory BP and pulse monitoring. BP readings average 120s/70s with few in 130s/70s. -Follow up in 4 months for reg OV: HTN, HLD   I have personally reviewed and noted the following in the patients chart:    Medical and social history  Use of alcohol, tobacco or illicit drugs   Current medications and supplements  Functional ability and status  Nutritional status  Physical activity  Advanced directives  List of other physicians  Hospitalizations, surgeries, and ER visits in previous 12 months  Vitals  Screenings to include cognitive, depression, and falls  Referrals and appointments  In addition, I have reviewed and discussed with patient certain preventive protocols, quality metrics, and best practice recommendations. A written personalized care plan for preventive services as well as general preventive health recommendations were provided to patient.

## 2020-05-14 ENCOUNTER — Other Ambulatory Visit: Payer: Self-pay | Admitting: Physician Assistant

## 2020-05-14 DIAGNOSIS — I10 Essential (primary) hypertension: Secondary | ICD-10-CM

## 2020-07-19 ENCOUNTER — Other Ambulatory Visit: Payer: Self-pay | Admitting: Physician Assistant

## 2020-07-19 DIAGNOSIS — I10 Essential (primary) hypertension: Secondary | ICD-10-CM

## 2020-07-22 ENCOUNTER — Other Ambulatory Visit: Payer: Self-pay | Admitting: Physician Assistant

## 2020-07-22 DIAGNOSIS — E782 Mixed hyperlipidemia: Secondary | ICD-10-CM

## 2020-07-29 ENCOUNTER — Other Ambulatory Visit: Payer: Self-pay | Admitting: Physician Assistant

## 2020-07-29 DIAGNOSIS — I10 Essential (primary) hypertension: Secondary | ICD-10-CM

## 2020-08-25 ENCOUNTER — Other Ambulatory Visit: Payer: Self-pay | Admitting: Family Medicine

## 2020-08-25 ENCOUNTER — Other Ambulatory Visit: Payer: Self-pay | Admitting: Physician Assistant

## 2020-08-25 DIAGNOSIS — Z1231 Encounter for screening mammogram for malignant neoplasm of breast: Secondary | ICD-10-CM

## 2020-09-13 ENCOUNTER — Ambulatory Visit (INDEPENDENT_AMBULATORY_CARE_PROVIDER_SITE_OTHER): Payer: Medicare HMO | Admitting: Physician Assistant

## 2020-09-13 ENCOUNTER — Encounter: Payer: Self-pay | Admitting: Physician Assistant

## 2020-09-13 ENCOUNTER — Other Ambulatory Visit: Payer: Self-pay

## 2020-09-13 ENCOUNTER — Telehealth: Payer: Self-pay | Admitting: Physician Assistant

## 2020-09-13 VITALS — BP 175/80 | HR 74 | Temp 98.2°F | Ht <= 58 in | Wt 119.2 lb

## 2020-09-13 DIAGNOSIS — M542 Cervicalgia: Secondary | ICD-10-CM

## 2020-09-13 DIAGNOSIS — E876 Hypokalemia: Secondary | ICD-10-CM | POA: Diagnosis not present

## 2020-09-13 DIAGNOSIS — I10 Essential (primary) hypertension: Secondary | ICD-10-CM

## 2020-09-13 MED ORDER — METHOCARBAMOL 500 MG PO TABS
500.0000 mg | ORAL_TABLET | Freq: Three times a day (TID) | ORAL | 0 refills | Status: DC | PRN
Start: 2020-09-13 — End: 2020-09-14

## 2020-09-13 NOTE — Progress Notes (Signed)
Acute Office Visit  Subjective:    Patient ID: Wendy Wheeler, female    DOB: 05-Feb-1951, 70 y.o.   MRN: [id number]  Chief Complaint  Patient presents with  . Neck Pain    HPI Patient is in today for c/o neck pain. Patient reports history of chronic cervical pain and used to see a chiropractor before moving to [place]. States recent flare-up has been ongoing x 1 week. Has tried heat/cold therapy and ibuprofen with minimal relief. In the past went to walk-in clinic in [place] ([place]) and was given a muscle relaxer which helped for exacerbation. Denies recent injury or trauma. Does report increased stress related to her husband's health. In addition, patient reports completed potassium supplement however failed to follow up for lab visit to repeat potassium level. Denies chest pain or arrhyhtmia, palpitations, dyspnea or edema.   Past Medical History:  Diagnosis Date  . Hyperlipidemia   . Hypertension     History reviewed. No pertinent surgical history.  Family History  Problem Relation Age of Onset  . Cancer Mother        breast  . Hyperlipidemia Mother   . Hypertension Mother   . Hypertension Father   . Hyperlipidemia Father   . Depression Father        suicide  . Cancer Sister        bladder  . Heart disease Paternal Grandmother   . Stroke Paternal Grandfather   . Bladder Cancer Sister   . Diabetes Brother   . Cerebral palsy Brother     Social History   Socioeconomic History  . Marital status: Married    Spouse name: Not on file  . Number of children: Not on file  . Years of education: Not on file  . Highest education level: Not on file  Occupational History  . Not on file  Tobacco Use  . Smoking status: Former Smoker    Types: Cigarettes    Quit date: 08/22/2005    Years since quitting: 15.0  . Smokeless tobacco: Never Used  Vaping Use  . Vaping Use: Never used  Substance and Sexual Activity  . Alcohol use: No  . Drug use: No  . Sexual activity: Yes     Partners: Male  Other Topics Concern  . Not on file  Social History Narrative  . Not on file   Social Determinants of Health   Financial Resource Strain: Not on file  Food Insecurity: Not on file  Transportation Needs: Not on file  Physical Activity: Not on file  Stress: Not on file  Social Connections: Not on file  Intimate Partner Violence: Not on file    Outpatient Medications Prior to Visit  Medication Sig Dispense Refill  . Ascorbic Acid (VITAMIN C) 1000 MG tablet Take 1,000 mg by mouth daily.    Marland Kitchen aspirin EC 81 MG tablet Take 81 mg by mouth daily.    Marland Kitchen atorvastatin (LIPITOR) 40 MG tablet TAKE 1 TABLET AT BEDTIME 90 tablet 0  . cholecalciferol (VITAMIN D) 1000 units tablet Take 1,000 Units by mouth daily. Takes 3 times daily- 3,000 QD    . hydrochlorothiazide (HYDRODIURIL) 25 MG tablet TAKE 1 TABLET EVERY DAY 90 tablet 0  . LORazepam (ATIVAN) 0.5 MG tablet 1/2 to 1 tablet by mouth prn panic attacks/ intense anxiety 30 tablet 0  . Menaquinone-7 (VITAMIN K2 PO) Take 1 tablet by mouth daily.    . metoprolol succinate (TOPROL-XL) 25 MG 24 hr tablet TAKE 1  TABLET EVERY DAY 90 tablet 0  . Multiple Vitamins-Minerals (HAIR SKIN AND NAILS FORMULA PO) Take 1 tablet by mouth daily.    . vitamin B-12 (CYANOCOBALAMIN) 1000 MCG tablet Take 1,000 mcg by mouth daily.    . potassium chloride (KLOR-CON) 10 MEQ tablet Take 1 tablet (10 mEq total) by mouth daily. (Patient not taking: Reported on 09/13/2020) 2 tablet 0   No facility-administered medications prior to visit.    Allergies  Allergen Reactions  . Aleve [Naproxen Sodium] Swelling    Review of Systems A fourteen system review of systems was performed and found to be positive as per HPI.  Objective:    Physical Exam General:  Well Developed, well nourished, appropriate for stated age.  Neuro:  Alert and oriented,  extra-ocular muscles intact  HEENT:  Normocephalic, atraumatic, neck supple Skin:  no gross rash, warm,  pink. Cardiac:  RRR, S1 S2 wnl's Respiratory:  ECTA B/L w/o wheezing, Not using accessory muscles, speaking in full sentences- unlabored. MSK: No step off, TTP of c-spine and trapezius area on left side, good strength and ROM of UE.  Vascular:  Ext warm, no cyanosis apprec.; no edema  Psych:  No HI/SI, judgement and insight good, Euthymic mood. Full Affect.  BP (!) 175/80   Pulse 74   Temp 98.2 F (36.8 C)   Ht 4' 10"  (1.473 m)   Wt 119 lb 3.2 oz (54.1 kg)   SpO2 98%   BMI 24.91 kg/m  Wt Readings from Last 3 Encounters:  09/13/20 119 lb 3.2 oz (54.1 kg)  04/26/20 115 lb (52.2 kg)  01/30/20 117 lb 3.2 oz (53.2 kg)    Health Maintenance Due  Topic Date Due  . TETANUS/TDAP  Never done  . PNA vac Low Risk Adult (1 of 2 - PCV13) Never done    There are no preventive care reminders to display for this patient.   Lab Results  Component Value Date   TSH 1.450 04/23/2020   Lab Results  Component Value Date   WBC 8.7 04/23/2020   HGB 14.0 04/23/2020   HCT 41.0 04/23/2020   MCV 89 04/23/2020   PLT 403 04/23/2020   Lab Results  Component Value Date   NA 137 04/23/2020   K 3.4 (L) 04/23/2020   CO2 25 04/23/2020   GLUCOSE 103 (H) 04/23/2020   BUN 15 04/23/2020   CREATININE 0.96 04/23/2020   BILITOT 0.6 04/23/2020   ALKPHOS 71 04/23/2020   AST 22 04/23/2020   ALT 19 04/23/2020   PROT 7.0 04/23/2020   ALBUMIN 4.4 04/23/2020   CALCIUM 10.0 04/23/2020   Lab Results  Component Value Date   CHOL 183 04/23/2020   Lab Results  Component Value Date   HDL 92 04/23/2020   Lab Results  Component Value Date   LDLCALC 80 04/23/2020   Lab Results  Component Value Date   TRIG 58 04/23/2020   Lab Results  Component Value Date   CHOLHDL 2.0 04/23/2020   Lab Results  Component Value Date   HGBA1C 5.8 (H) 04/23/2020       Assessment & Plan:   Problem List Items Addressed This Visit      Cardiovascular and Mediastinum   White coat syndrome with diagnosis of  hypertension (Chronic)    Other Visit Diagnoses    Neck pain    -  Primary   Relevant Medications   methocarbamol (ROBAXIN) 500 MG tablet   Other Relevant Orders   Ambulatory referral  to Physical Therapy   Hypokalemia       Relevant Orders   Comp Met (CMET)     Neck pain: -Likely MSK etiology and discussed with patient management options. Agreeable to PT and will trial muscle relaxer. Advised to let me know if unable to tolerate medication due to drowsiness. -Continue with heat/cold therapy and recommend gentle stretches/exercises. -If symptoms fail to improve or worsen recommend further evaluation with imaging studies (Cervical Xray).  White coat syndrome with diagnosis of hypertension: -BP elevated in office. Ambulatory BP readings stable. Asymptomatic. -Will continue current medication regimen. -Will continue to monitor.  Hypokalemia: -Will collect CMP to repeat potassium. Patient on diuretic therapy.    Meds ordered this encounter  Medications  . methocarbamol (ROBAXIN) 500 MG tablet    Sig: Take 1 tablet (500 mg total) by mouth every 8 (eight) hours as needed for muscle spasms.    Dispense:  30 tablet    Refill:  0    Order Specific Question:   Supervising Provider    Answer:   Beatrice Lecher D [[id number]]     Lorrene Reid, PA-C

## 2020-09-13 NOTE — Telephone Encounter (Signed)
Pt scheduled to come in today at 10am with [facility]. AS, CMA

## 2020-09-13 NOTE — Patient Instructions (Signed)

## 2020-09-13 NOTE — Telephone Encounter (Signed)
Patient has a pinched nerve on the left side of her neck in the C section going down to her shoulder. Please advise, thanks.

## 2020-09-14 ENCOUNTER — Telehealth: Payer: Self-pay | Admitting: Physician Assistant

## 2020-09-14 DIAGNOSIS — M542 Cervicalgia: Secondary | ICD-10-CM

## 2020-09-14 LAB — COMPREHENSIVE METABOLIC PANEL
ALT: 19 IU/L (ref 0–32)
AST: 24 IU/L (ref 0–40)
Albumin/Globulin Ratio: 1.9 (ref 1.2–2.2)
Albumin: 4.7 g/dL (ref 3.8–4.8)
Alkaline Phosphatase: 72 IU/L (ref 44–121)
BUN/Creatinine Ratio: 16 (ref 12–28)
BUN: 14 mg/dL (ref 8–27)
Bilirubin Total: 0.4 mg/dL (ref 0.0–1.2)
CO2: 24 mmol/L (ref 20–29)
Calcium: 10.5 mg/dL — ABNORMAL HIGH (ref 8.7–10.3)
Chloride: 97 mmol/L (ref 96–106)
Creatinine, Ser: 0.89 mg/dL (ref 0.57–1.00)
Globulin, Total: 2.5 g/dL (ref 1.5–4.5)
Glucose: 104 mg/dL — ABNORMAL HIGH (ref 65–99)
Potassium: 3.8 mmol/L (ref 3.5–5.2)
Sodium: 138 mmol/L (ref 134–144)
Total Protein: 7.2 g/dL (ref 6.0–8.5)
eGFR: 71 mL/min/{1.73_m2} (ref >59)

## 2020-09-14 MED ORDER — CYCLOBENZAPRINE HCL 10 MG PO TABS: 10.0000 mg | ORAL_TABLET | Freq: Three times a day (TID) | ORAL | 0 refills | Status: DC | PRN

## 2020-09-14 NOTE — Addendum Note (Signed)
Addended by: Mickel Crow on: 09/14/2020 09:06 AM   Modules accepted: Orders

## 2020-09-14 NOTE — Telephone Encounter (Signed)
Patient states the medication methocarbamol and its not working, as it is the lowest does. Patient would like to explore other options. Within two hours the pain was back, within 4 it was awful, within 6 hours she took another pill due to pain being bad instead of 8 hours. Please advise, thanks.

## 2020-09-14 NOTE — Telephone Encounter (Signed)
Per Herb Grays d/c Methocarbamol and sending Flexaril 10mg  TID #30 to [facility]. Pt is aware and verbalized understanding to d/c previous medication. AS, CMA

## 2020-10-19 ENCOUNTER — Other Ambulatory Visit: Payer: Self-pay

## 2020-10-19 ENCOUNTER — Ambulatory Visit
Admission: RE | Admit: 2020-10-19 | Discharge: 2020-10-19 | Disposition: A | Discharge status: Home / Self Care | Payer: Medicare HMO | Source: Ambulatory Visit | Attending: Physician Assistant | Admitting: Physician Assistant

## 2020-10-19 DIAGNOSIS — Z1231 Encounter for screening mammogram for malignant neoplasm of breast: Secondary | ICD-10-CM

## 2020-11-02 ENCOUNTER — Telehealth: Payer: Self-pay | Admitting: Physician Assistant

## 2020-11-02 NOTE — Telephone Encounter (Signed)
Patient asked for you to call her tomorrow concerning physical therapy. Thanks

## 2020-11-08 ENCOUNTER — Ambulatory Visit: Payer: Medicare HMO | Admitting: Family Medicine

## 2020-11-22 ENCOUNTER — Other Ambulatory Visit: Payer: Self-pay

## 2020-11-22 ENCOUNTER — Ambulatory Visit (INDEPENDENT_AMBULATORY_CARE_PROVIDER_SITE_OTHER): Payer: Medicare HMO | Admitting: Family Medicine

## 2020-11-22 ENCOUNTER — Encounter: Payer: Self-pay | Admitting: Family Medicine

## 2020-11-22 VITALS — BP 164/96 | Ht <= 58 in | Wt 119.0 lb

## 2020-11-22 DIAGNOSIS — S161XXA Strain of muscle, fascia and tendon at neck level, initial encounter: Secondary | ICD-10-CM | POA: Diagnosis not present

## 2020-11-22 NOTE — Assessment & Plan Note (Signed)
Likely has an underlying degenerative component of the facet joint that is contributing to an underlying pain.  No radicular pain and seems more muscle related at this time. -Counseled on home exercise therapy and supportive care. -Referral to physical therapy. -Could consider trigger point injections or further imaging.

## 2020-11-22 NOTE — Progress Notes (Signed)
  Wendy Wheeler - 70 y.o. female MRN [id number]  Date of birth: July 09, 1950  SUBJECTIVE:  Including CC & ROS.  No chief complaint on file.   Wendy Wheeler is a 70 y.o. female that is presenting with acute on chronic left-sided neck pain.  She has been to different chiropractors in the past.  Denies any radicular pain.   Review of Systems See HPI   HISTORY: Past Medical, Surgical, Social, and Family History Reviewed & Updated per EMR.   Pertinent Historical Findings include:  Past Medical History:  Diagnosis Date  . Hyperlipidemia   . Hypertension     History reviewed. No pertinent surgical history.  Family History  Problem Relation Age of Onset  . Cancer Mother        breast  . Hyperlipidemia Mother   . Hypertension Mother   . Hypertension Father   . Hyperlipidemia Father   . Depression Father        suicide  . Cancer Sister        bladder  . Heart disease Paternal Grandmother   . Stroke Paternal Grandfather   . Bladder Cancer Sister   . Diabetes Brother   . Cerebral palsy Brother     Social History   Socioeconomic History  . Marital status: Married    Spouse name: Not on file  . Number of children: Not on file  . Years of education: Not on file  . Highest education level: Not on file  Occupational History  . Not on file  Tobacco Use  . Smoking status: Former Smoker    Types: Cigarettes    Quit date: 08/22/2005    Years since quitting: 15.2  . Smokeless tobacco: Never Used  Vaping Use  . Vaping Use: Never used  Substance and Sexual Activity  . Alcohol use: No  . Drug use: No  . Sexual activity: Yes    Partners: Male  Other Topics Concern  . Not on file  Social History Narrative  . Not on file   Social Determinants of Health   Financial Resource Strain: Not on file  Food Insecurity: Not on file  Transportation Needs: Not on file  Physical Activity: Not on file  Stress: Not on file  Social Connections: Not on file  Intimate Partner  Violence: Not on file     PHYSICAL EXAM:  VS: BP (!) 164/96 (BP Location: Left Arm, Patient Position: Sitting, Cuff Size: Normal)   Ht 4\' 10"  (1.473 m)   Wt 119 lb (54 kg)   BMI 24.87 kg/m  Physical Exam Gen: NAD, alert, cooperative with exam, well-appearing MSK:  Neck: Limited rotation to the left. Normal shoulder range of motion. Normal strength resistance. Neurovascular intact     ASSESSMENT & PLAN:   Cervical strain Likely has an underlying degenerative component of the facet joint that is contributing to an underlying pain.  No radicular pain and seems more muscle related at this time. -Counseled on home exercise therapy and supportive care. -Referral to physical therapy. -Could consider trigger point injections or further imaging.

## 2020-11-22 NOTE — Patient Instructions (Signed)
Nice to meet you Please try heat  Please try physical therapy  Please try the voltaren   Please send me a message in MyChart with any questions or updates.  Please see me back in 4 weeks.   --Dr. Raeford Razor

## 2020-11-24 ENCOUNTER — Other Ambulatory Visit: Payer: Self-pay

## 2020-11-24 ENCOUNTER — Telehealth: Payer: Self-pay | Admitting: Physician Assistant

## 2020-11-24 DIAGNOSIS — I10 Essential (primary) hypertension: Secondary | ICD-10-CM

## 2020-11-24 MED ORDER — HYDROCHLOROTHIAZIDE 25 MG PO TABS: 1.0000 | ORAL_TABLET | Freq: Every day | ORAL | 0 refills | Status: DC

## 2020-11-24 MED ORDER — METOPROLOL SUCCINATE ER 25 MG PO TB24: 1.0000 | ORAL_TABLET | Freq: Every day | ORAL | 0 refills | Status: DC

## 2020-11-24 NOTE — Telephone Encounter (Signed)
Please reach out to patient to schedule a follow up appointment per her last AVS and make her aware the refills have been sent.

## 2020-11-24 NOTE — Telephone Encounter (Signed)
Patient contacted and made aware via voicemail.

## 2020-11-24 NOTE — Telephone Encounter (Signed)
Patient is requesting refills on hydrochlorothiazide and metoprolol succinate and uses [facility] mail delivery. Thanks

## 2020-11-30 DIAGNOSIS — S161XXD Strain of muscle, fascia and tendon at neck level, subsequent encounter: Secondary | ICD-10-CM | POA: Diagnosis not present

## 2020-11-30 DIAGNOSIS — M542 Cervicalgia: Secondary | ICD-10-CM | POA: Diagnosis not present

## 2020-11-30 DIAGNOSIS — M62838 Other muscle spasm: Secondary | ICD-10-CM | POA: Diagnosis not present

## 2020-12-02 DIAGNOSIS — M542 Cervicalgia: Secondary | ICD-10-CM | POA: Diagnosis not present

## 2020-12-02 DIAGNOSIS — M62838 Other muscle spasm: Secondary | ICD-10-CM | POA: Diagnosis not present

## 2020-12-02 DIAGNOSIS — S161XXD Strain of muscle, fascia and tendon at neck level, subsequent encounter: Secondary | ICD-10-CM | POA: Diagnosis not present

## 2020-12-09 DIAGNOSIS — S161XXD Strain of muscle, fascia and tendon at neck level, subsequent encounter: Secondary | ICD-10-CM | POA: Diagnosis not present

## 2020-12-09 DIAGNOSIS — M542 Cervicalgia: Secondary | ICD-10-CM | POA: Diagnosis not present

## 2020-12-09 DIAGNOSIS — M62838 Other muscle spasm: Secondary | ICD-10-CM | POA: Diagnosis not present

## 2020-12-15 DIAGNOSIS — M542 Cervicalgia: Secondary | ICD-10-CM | POA: Diagnosis not present

## 2020-12-15 DIAGNOSIS — S161XXD Strain of muscle, fascia and tendon at neck level, subsequent encounter: Secondary | ICD-10-CM | POA: Diagnosis not present

## 2020-12-15 DIAGNOSIS — M62838 Other muscle spasm: Secondary | ICD-10-CM | POA: Diagnosis not present

## 2020-12-17 DIAGNOSIS — M62838 Other muscle spasm: Secondary | ICD-10-CM | POA: Diagnosis not present

## 2020-12-17 DIAGNOSIS — M542 Cervicalgia: Secondary | ICD-10-CM | POA: Diagnosis not present

## 2020-12-17 DIAGNOSIS — S161XXD Strain of muscle, fascia and tendon at neck level, subsequent encounter: Secondary | ICD-10-CM | POA: Diagnosis not present

## 2020-12-22 ENCOUNTER — Ambulatory Visit: Payer: Medicare HMO | Admitting: Internal Medicine

## 2020-12-24 ENCOUNTER — Ambulatory Visit: Payer: Medicare HMO | Admitting: Family Medicine

## 2020-12-24 DIAGNOSIS — M542 Cervicalgia: Secondary | ICD-10-CM | POA: Diagnosis not present

## 2020-12-24 DIAGNOSIS — S161XXD Strain of muscle, fascia and tendon at neck level, subsequent encounter: Secondary | ICD-10-CM | POA: Diagnosis not present

## 2020-12-24 DIAGNOSIS — M62838 Other muscle spasm: Secondary | ICD-10-CM | POA: Diagnosis not present

## 2020-12-30 DIAGNOSIS — H35453 Secondary pigmentary degeneration, bilateral: Secondary | ICD-10-CM | POA: Diagnosis not present

## 2020-12-30 DIAGNOSIS — H35411 Lattice degeneration of retina, right eye: Secondary | ICD-10-CM | POA: Diagnosis not present

## 2020-12-30 DIAGNOSIS — H35433 Paving stone degeneration of retina, bilateral: Secondary | ICD-10-CM | POA: Diagnosis not present

## 2020-12-30 DIAGNOSIS — H35372 Puckering of macula, left eye: Secondary | ICD-10-CM | POA: Diagnosis not present

## 2020-12-30 DIAGNOSIS — Q141 Congenital malformation of retina: Secondary | ICD-10-CM | POA: Diagnosis not present

## 2020-12-30 DIAGNOSIS — H43393 Other vitreous opacities, bilateral: Secondary | ICD-10-CM | POA: Diagnosis not present

## 2020-12-30 DIAGNOSIS — H5213 Myopia, bilateral: Secondary | ICD-10-CM | POA: Diagnosis not present

## 2020-12-31 DIAGNOSIS — M62838 Other muscle spasm: Secondary | ICD-10-CM | POA: Diagnosis not present

## 2020-12-31 DIAGNOSIS — M542 Cervicalgia: Secondary | ICD-10-CM | POA: Diagnosis not present

## 2020-12-31 DIAGNOSIS — S161XXD Strain of muscle, fascia and tendon at neck level, subsequent encounter: Secondary | ICD-10-CM | POA: Diagnosis not present

## 2021-01-05 DIAGNOSIS — M62838 Other muscle spasm: Secondary | ICD-10-CM | POA: Diagnosis not present

## 2021-01-05 DIAGNOSIS — S161XXD Strain of muscle, fascia and tendon at neck level, subsequent encounter: Secondary | ICD-10-CM | POA: Diagnosis not present

## 2021-01-05 DIAGNOSIS — M542 Cervicalgia: Secondary | ICD-10-CM | POA: Diagnosis not present

## 2021-01-07 DIAGNOSIS — M62838 Other muscle spasm: Secondary | ICD-10-CM | POA: Diagnosis not present

## 2021-01-07 DIAGNOSIS — S161XXD Strain of muscle, fascia and tendon at neck level, subsequent encounter: Secondary | ICD-10-CM | POA: Diagnosis not present

## 2021-01-07 DIAGNOSIS — M542 Cervicalgia: Secondary | ICD-10-CM | POA: Diagnosis not present

## 2021-01-12 DIAGNOSIS — M542 Cervicalgia: Secondary | ICD-10-CM | POA: Diagnosis not present

## 2021-01-12 DIAGNOSIS — M62838 Other muscle spasm: Secondary | ICD-10-CM | POA: Diagnosis not present

## 2021-01-12 DIAGNOSIS — S161XXD Strain of muscle, fascia and tendon at neck level, subsequent encounter: Secondary | ICD-10-CM | POA: Diagnosis not present

## 2021-01-19 DIAGNOSIS — H25813 Combined forms of age-related cataract, bilateral: Secondary | ICD-10-CM | POA: Diagnosis not present

## 2021-01-19 DIAGNOSIS — Q141 Congenital malformation of retina: Secondary | ICD-10-CM | POA: Diagnosis not present

## 2021-01-19 DIAGNOSIS — H35453 Secondary pigmentary degeneration, bilateral: Secondary | ICD-10-CM | POA: Diagnosis not present

## 2021-01-19 DIAGNOSIS — H11432 Conjunctival hyperemia, left eye: Secondary | ICD-10-CM | POA: Diagnosis not present

## 2021-01-19 DIAGNOSIS — H33322 Round hole, left eye: Secondary | ICD-10-CM | POA: Diagnosis not present

## 2021-01-19 DIAGNOSIS — H35372 Puckering of macula, left eye: Secondary | ICD-10-CM | POA: Diagnosis not present

## 2021-01-19 DIAGNOSIS — H10012 Acute follicular conjunctivitis, left eye: Secondary | ICD-10-CM | POA: Diagnosis not present

## 2021-02-02 ENCOUNTER — Ambulatory Visit (INDEPENDENT_AMBULATORY_CARE_PROVIDER_SITE_OTHER): Payer: Medicare HMO | Admitting: Internal Medicine

## 2021-02-02 ENCOUNTER — Other Ambulatory Visit: Payer: Self-pay

## 2021-02-02 VITALS — BP 172/88 | HR 72 | Ht <= 58 in | Wt 118.6 lb

## 2021-02-02 DIAGNOSIS — I10 Essential (primary) hypertension: Secondary | ICD-10-CM

## 2021-02-02 DIAGNOSIS — I2510 Atherosclerotic heart disease of native coronary artery without angina pectoris: Secondary | ICD-10-CM

## 2021-02-02 DIAGNOSIS — E782 Mixed hyperlipidemia: Secondary | ICD-10-CM | POA: Diagnosis not present

## 2021-02-02 DIAGNOSIS — I2584 Coronary atherosclerosis due to calcified coronary lesion: Secondary | ICD-10-CM

## 2021-02-02 NOTE — Progress Notes (Signed)
OFFICE CONSULT NOTE  Chief Complaint:  Routine follow-up  Primary Care Physician: Lorrene Reid, PA-C  HPI:  Wendy Wheeler is a 70 y.o. female who is being seen today for the evaluation of coronary artery calcification at the request of Lorrene Reid, PA-C.  This is a pleasant 70 year old female with a history of long-standing tobacco abuse, who stopped smoking 2010.  She recently established her care with Dr. Raliegh Scarlet.  She also has a history of hypertension and dyslipidemia.  She underwent a screening CT scan to look for any possible lung malignancy, and was found to have calcification of the LAD.  She reports being asymptomatic, denying any chest pain.  She does get short of breath if she goes up and down the stairs more than 10 times.  She is very physically active.  She has treated hypertension and recently was noted to have dyslipidemia.  Her LDL was 112.  She was placed on atorvastatin 20 mg daily and is tolerating that well.  Her LDL most recently was 81.  Her goal LDL is less than 70.  We discussed diet today.  She eats a somewhat atherogenic diet.  She eats a lot of red meat and is recently switched from fried foods more to baking.  She is tended to use vegetable oil but is moving towards all of oil and canola oil.  She is also worked on reducing other sources of saturated fats in her diet.  03/05/2018  Wendy Wheeler is seen today in follow-up.  Overall she continues to do well.  She denies chest pain or worsening shortness of breath.  After making significant dietary changes and please report her cholesterol profile is improved.  Total cholesterol now 178, HDL 42, triglycerides 89 and LDL of 81.  This is close to goal LDL less than 70.  She remains on low-dose atorvastatin 20 mg daily.  She denies any chest pain or worsening shortness of breath.  Blood pressure was elevated today 174/84 and may need to be followed closely.  She has not done a lot of physical activity, having concerned  about her abnormal calcium score.  I reassured her that she could do exertion and should monitor for any symptoms with exercise.  10/31/2019  Wendy Wheeler is seen today for follow-up.  Overall she is without new complaints.  Recent lipids were total cholesterol 185, triglycerides 60, HDL 95 and LDL 79.  She is on atorvastatin 40 and LDL now is much better controlled.  I encouraged her to continue work on diet and exercise.  She reports not being a smoker although did some in the past.  She is noted to be wheezy today.  She thinks it might be related to allergies which have been worse since she has been in [place].  02/02/2021  Wendy Wheeler is seen today in follow-up.  Overall she says she is feeling well.  She says she does about 50 stairs a day and has no chest pain or worsening shortness of breath.  She does have somewhat of a bronchitic cough at times.  I reminded her that her CT scan last in December 2020 did show some centrilobular emphysema and she has almost a 60-pack-year smoking history.  Fortunately she quit however she is not currently on any inhalers at this time.  She may need further pulmonary optimization.  She denies any chest pain.  Her cholesterol is higher than target.  Her last LDL was 80.  She said she was not  regularly taking atorvastatin but has been recently and has follow-up with her PCP soon who will repeat labs.  Blood pressure was elevated today however again she said this may be whitecoat hypertension.  She says her home blood pressure readings generally between 120s to 130s over 70s.  PMHx:  Past Medical History:  Diagnosis Date   Hyperlipidemia    Hypertension     No past surgical history on file.  FAMHx:  Family History  Problem Relation Age of Onset   Cancer Mother        breast   Hyperlipidemia Mother    Hypertension Mother    Hypertension Father    Hyperlipidemia Father    Depression Father        suicide   Cancer Sister        bladder   Heart disease  Paternal Grandmother    Stroke Paternal Grandfather    Bladder Cancer Sister    Diabetes Brother    Cerebral palsy Brother     SOCHx:   reports that she quit smoking about 15 years ago. Her smoking use included cigarettes. She has never used smokeless tobacco. She reports that she does not drink alcohol and does not use drugs.  ALLERGIES:  Allergies  Allergen Reactions   Aleve [Naproxen Sodium] Swelling    ROS: Pertinent items noted in HPI and remainder of comprehensive ROS otherwise negative.  HOME MEDS: Current Outpatient Medications on File Prior to Visit  Medication Sig Dispense Refill   Ascorbic Acid (VITAMIN C) 1000 MG tablet Take 1,000 mg by mouth daily.     aspirin EC 81 MG tablet Take 81 mg by mouth daily.     atorvastatin (LIPITOR) 40 MG tablet TAKE 1 TABLET AT BEDTIME 90 tablet 0   cholecalciferol (VITAMIN D) 1000 units tablet Take 1,000 Units by mouth daily. Takes 3 times daily- 3,000 QD     cyclobenzaprine (FLEXERIL) 10 MG tablet Take 1 tablet (10 mg total) by mouth 3 (three) times daily as needed for muscle spasms. 30 tablet 0   hydrochlorothiazide (HYDRODIURIL) 25 MG tablet Take 1 tablet (25 mg total) by mouth daily. 90 tablet 0   Menaquinone-7 (VITAMIN K2 PO) Take 1 tablet by mouth daily.     metoprolol succinate (TOPROL-XL) 25 MG 24 hr tablet Take 1 tablet (25 mg total) by mouth daily. 90 tablet 0   Multiple Vitamins-Minerals (HAIR SKIN AND NAILS FORMULA PO) Take 1 tablet by mouth daily.     vitamin B-12 (CYANOCOBALAMIN) 1000 MCG tablet Take 1,000 mcg by mouth daily.     No current facility-administered medications on file prior to visit.    LABS/IMAGING: No results found for this or any previous visit (from the past 48 hour(s)). No results found.  LIPID PANEL:    Component Value Date/Time   CHOL 183 04/23/2020 0820   TRIG 58 04/23/2020 0820   HDL 92 04/23/2020 0820   CHOLHDL 2.0 04/23/2020 0820   LDLCALC 80 04/23/2020 0820    WEIGHTS: Wt Readings  from Last 3 Encounters:  11/22/20 119 lb (54 kg)  09/13/20 119 lb 3.2 oz (54.1 kg)  04/26/20 115 lb (52.2 kg)    VITALS: There were no vitals taken for this visit.  EXAM: General appearance: alert and no distress Neck: no carotid bruit, no JVD and thyroid not enlarged, symmetric, no tenderness/mass/nodules Lungs: wheezes bilaterally Heart: regular rate and rhythm Abdomen: soft, non-tender; bowel sounds normal; no masses,  no organomegaly and scaphoid Extremities: extremities normal,  atraumatic, no cyanosis or edema Pulses: 2+ and symmetric Skin: Skin color, texture, turgor normal. No rashes or lesions Neurologic: Grossly normal Psych: Pleasant  EKG: Normal sinus rhythm at 72 -personally reviewed  ASSESSMENT: Single-vessel coronary artery calcification of the LAD History of tobacco abuse -wheezing, question chronic bronchitis  Hypertension Dyslipidemia  PLAN: 1.   Mrs. Money seems to be doing well without cardiac chest pain.  She has no worsening dyspnea but does get some bronchitic sounding cough.  She has a history of smoking in the past and emphysematous changes on her CT.  She should follow-up with her PCP on this.  Blood pressure was elevated however there is a whitecoat component.  Her home blood pressure readings are lower.  Cholesterol still above target but she has been more compliant with her atorvastatin recently and will have repeat labs with her PCP in the near future.  Plan follow-up with me annually or sooner as necessary.  Pixie Casino, MD, [facility], [place] Director of the Advanced Lipid Disorders &  Cardiovascular Risk Reduction Clinic Diplomate of the American Board of Clinical Lipidology Attending Cardiologist  Direct Dial: [phone]  Fax: [phone]  Website:  [email or web address] 02/02/2021, 1:41 PM

## 2021-02-02 NOTE — Patient Instructions (Signed)
Medication Instructions:  Your Physician recommend you continue on your current medication as directed.    *If you need a refill on your cardiac medications before your next appointment, please call your pharmacy*   Lab Work: None ordered today   Testing/Procedures: None ordered today    Follow-Up: At [facility], you and your health needs are our priority.  As part of our continuing mission to provide you with exceptional heart care, we have created designated Provider Care Teams.  These Care Teams include your primary Cardiologist (physician) and Advanced Practice Providers (APPs -  Physician Assistants and Nurse Practitioners) who all work together to provide you with the care you need, when you need it.  We recommend signing up for the patient portal called "MyChart".  Sign up information is provided on this After Visit Summary.  MyChart is used to connect with patients for Virtual Visits (Telemedicine).  Patients are able to view lab/test results, encounter notes, upcoming appointments, etc.  Non-urgent messages can be sent to your provider as well.   To learn more about what you can do with MyChart, go to [email or web address].    Your next appointment:   1 year(s)  The format for your next appointment:   In Person  Provider:   K. Chad Hilty, MD    

## 2021-02-17 ENCOUNTER — Other Ambulatory Visit: Payer: Self-pay | Admitting: Physician Assistant

## 2021-02-17 DIAGNOSIS — I10 Essential (primary) hypertension: Secondary | ICD-10-CM

## 2021-02-23 ENCOUNTER — Other Ambulatory Visit: Payer: Self-pay | Admitting: Physician Assistant

## 2021-02-23 DIAGNOSIS — I10 Essential (primary) hypertension: Secondary | ICD-10-CM

## 2021-02-28 ENCOUNTER — Ambulatory Visit: Payer: Medicare HMO | Admitting: Physician Assistant

## 2021-02-28 ENCOUNTER — Other Ambulatory Visit: Payer: Self-pay

## 2021-02-28 DIAGNOSIS — E782 Mixed hyperlipidemia: Secondary | ICD-10-CM

## 2021-02-28 MED ORDER — ATORVASTATIN CALCIUM 40 MG PO TABS: 40.0000 mg | ORAL_TABLET | Freq: Every day | ORAL | 0 refills | Status: DC

## 2021-03-08 ENCOUNTER — Ambulatory Visit (INDEPENDENT_AMBULATORY_CARE_PROVIDER_SITE_OTHER): Payer: Medicare HMO | Admitting: Physician Assistant

## 2021-03-08 ENCOUNTER — Other Ambulatory Visit: Payer: Self-pay

## 2021-03-08 ENCOUNTER — Encounter: Payer: Self-pay | Admitting: Physician Assistant

## 2021-03-08 VITALS — BP 156/73 | HR 73 | Temp 97.7°F | Ht <= 58 in | Wt 119.6 lb

## 2021-03-08 DIAGNOSIS — I10 Essential (primary) hypertension: Secondary | ICD-10-CM

## 2021-03-08 DIAGNOSIS — F419 Anxiety disorder, unspecified: Secondary | ICD-10-CM | POA: Diagnosis not present

## 2021-03-08 DIAGNOSIS — J432 Centrilobular emphysema: Secondary | ICD-10-CM

## 2021-03-08 DIAGNOSIS — R1013 Epigastric pain: Secondary | ICD-10-CM | POA: Diagnosis not present

## 2021-03-08 DIAGNOSIS — E782 Mixed hyperlipidemia: Secondary | ICD-10-CM | POA: Diagnosis not present

## 2021-03-08 DIAGNOSIS — M542 Cervicalgia: Secondary | ICD-10-CM

## 2021-03-08 MED ORDER — CYCLOBENZAPRINE HCL 10 MG PO TABS: 10.0000 mg | ORAL_TABLET | Freq: Three times a day (TID) | ORAL | 0 refills | Status: DC | PRN

## 2021-03-08 NOTE — Assessment & Plan Note (Signed)
-   Last lipid panel wnl's, LDL 80 (goal <70) - Continue current medication regimen.  - Patient is non-fasting so will collect fasting labs with [facility] in 2 months.

## 2021-03-08 NOTE — Patient Instructions (Signed)

## 2021-03-08 NOTE — Assessment & Plan Note (Signed)
-  BP chronically elevated in office. Ambulatory BP readings stable. Will continue current medication regimen. -Discussed to continue ambulatory BP monitoring and checking BP at least 2 hours after taking medications. -Will continue to monitor.

## 2021-03-08 NOTE — Progress Notes (Signed)
 Established Patient Office Visit  Subjective:  Patient ID: Wendy Wheeler, female    DOB: 07/02/1950  Age: 70 y.o. MRN: [id number]  CC:  Chief Complaint  Patient presents with   Hypertension   Hyperlipidemia     HPI Wendy Wheeler presents for follow up on hypertension and hyperlipidemia. Patient has c/o neck pain, feels like a tightness behind her neck which radiates to shoulder. Did take some flexeril which provided moderate relief. States her anxiety has been doing fine. Uses program the emotion code to help with anxiety. Also reports when feeling full and tries to eat something else such as dessert will have a burning sensation down her chest into epigastric area. Denies fever, n/v/d, or unintentional weight loss.  HTN: Pt denies chest pain, palpitations, dizziness or daily lower extremity swelling. Does report some lower edema which improves after elevation. Taking medication as directed without side effects. Checks BP at home and readings range 130s/80s. This morning her BP reading was 144/90s, which she took before taking her medications.   HLD: Pt taking medication as directed without issues. Denies side effects including myalgias or muscle weakness.    Prediabetes: Denies increased thirst or urination. Trying to drink more water and limit sodas.  Past Medical History:  Diagnosis Date   Hyperlipidemia    Hypertension     History reviewed. No pertinent surgical history.  Family History  Problem Relation Age of Onset   Cancer Mother        breast   Hyperlipidemia Mother    Hypertension Mother    Hypertension Father    Hyperlipidemia Father    Depression Father        suicide   Cancer Sister        bladder   Heart disease Paternal Grandmother    Stroke Paternal Grandfather    Bladder Cancer Sister    Diabetes Brother    Cerebral palsy Brother     Social History   Socioeconomic History   Marital status: Married    Spouse name: Not on file   Number of  children: Not on file   Years of education: Not on file   Highest education level: Not on file  Occupational History   Not on file  Tobacco Use   Smoking status: Former    Types: Cigarettes    Quit date: 08/22/2005    Years since quitting: 15.5   Smokeless tobacco: Never  Vaping Use   Vaping Use: Never used  Substance and Sexual Activity   Alcohol use: No   Drug use: No   Sexual activity: Yes    Partners: Male  Other Topics Concern   Not on file  Social History Narrative   Not on file   Social Determinants of Health   Financial Resource Strain: Not on file  Food Insecurity: Not on file  Transportation Needs: Not on file  Physical Activity: Not on file  Stress: Not on file  Social Connections: Not on file  Intimate Partner Violence: Not on file    Outpatient Medications Prior to Visit  Medication Sig Dispense Refill   Ascorbic Acid (VITAMIN C) 1000 MG tablet Take 1,000 mg by mouth daily.     aspirin EC 81 MG tablet Take 81 mg by mouth daily.     atorvastatin (LIPITOR) 40 MG tablet Take 1 tablet (40 mg total) by mouth at bedtime. 30 tablet 0   cholecalciferol (VITAMIN D) 1000 units tablet Take 1,000 Units by mouth daily.   Takes 3 times daily- 3,000 QD     hydrochlorothiazide (HYDRODIURIL) 25 MG tablet TAKE 1 TABLET EVERY DAY 90 tablet 0   Menaquinone-7 (VITAMIN K2 PO) Take 1 tablet by mouth daily.     metoprolol succinate (TOPROL-XL) 25 MG 24 hr tablet TAKE 1 TABLET EVERY DAY 90 tablet 0   Multiple Vitamins-Minerals (HAIR SKIN AND NAILS FORMULA PO) Take 1 tablet by mouth daily.     vitamin B-12 (CYANOCOBALAMIN) 1000 MCG tablet Take 1,000 mcg by mouth daily.     cyclobenzaprine (FLEXERIL) 10 MG tablet Take 1 tablet (10 mg total) by mouth 3 (three) times daily as needed for muscle spasms. 30 tablet 0   No facility-administered medications prior to visit.    Allergies  Allergen Reactions   Aleve [Naproxen Sodium] Swelling    ROS Review of Systems Review of Systems:   A fourteen system review of systems was performed and found to be positive as per HPI.   Objective:    Physical Exam General:  Well Developed, well nourished, appropriate for stated age.  Neuro:  Alert and oriented,  extra-ocular muscles intact  HEENT:  Normocephalic, atraumatic, no adenopathy  Skin:  no gross rash, warm, pink. Cardiac:  RRR, S1 S2 Respiratory:  CTA B/L, Not using accessory muscles, speaking in full sentences- unlabored. MSK: No step-off or deformity noted, overall good cervical ROM, some spasticity noted of trapezoid area (right)  Vascular:  Ext warm, no cyanosis apprec.; cap RF less 2 sec. No edema Psych:  No HI/SI, judgement and insight good, Euthymic mood. Full Affect.  BP (!) 156/73   Pulse 73   Temp 97.7 F (36.5 C)   Ht 4' 10" (1.473 m)   Wt 119 lb 9.6 oz (54.3 kg)   SpO2 98%   BMI 25.00 kg/m  Wt Readings from Last 3 Encounters:  03/08/21 119 lb 9.6 oz (54.3 kg)  02/02/21 118 lb 9.6 oz (53.8 kg)  11/22/20 119 lb (54 kg)     Health Maintenance Due  Topic Date Due   TETANUS/TDAP  Never done   Zoster Vaccines- Shingrix (1 of 2) Never done   COVID-19 Vaccine (4 - Booster for Pfizer series) 06/26/2020   INFLUENZA VACCINE  01/17/2021    There are no preventive care reminders to display for this patient.  Lab Results  Component Value Date   TSH 1.450 04/23/2020   Lab Results  Component Value Date   WBC 8.7 04/23/2020   HGB 14.0 04/23/2020   HCT 41.0 04/23/2020   MCV 89 04/23/2020   PLT 403 04/23/2020   Lab Results  Component Value Date   NA 138 09/13/2020   K 3.8 09/13/2020   CO2 24 09/13/2020   GLUCOSE 104 (H) 09/13/2020   BUN 14 09/13/2020   CREATININE 0.89 09/13/2020   BILITOT 0.4 09/13/2020   ALKPHOS 72 09/13/2020   AST 24 09/13/2020   ALT 19 09/13/2020   PROT 7.2 09/13/2020   ALBUMIN 4.7 09/13/2020   CALCIUM 10.5 (H) 09/13/2020   EGFR 71 09/13/2020   Lab Results  Component Value Date   CHOL 183 04/23/2020   Lab Results   Component Value Date   HDL 92 04/23/2020   Lab Results  Component Value Date   LDLCALC 80 04/23/2020   Lab Results  Component Value Date   TRIG 58 04/23/2020   Lab Results  Component Value Date   CHOLHDL 2.0 04/23/2020   Lab Results  Component Value Date   HGBA1C 5.8 (  H) 04/23/2020   Depression screen PHQ 2/9 03/08/2021 09/13/2020 04/26/2020 01/30/2020 09/24/2019  Decreased Interest 0 0 0 0 0  Down, Depressed, Hopeless 0 0 0 0 0  PHQ - 2 Score 0 0 0 0 0  Altered sleeping _0 Tired, decreased energy 0 0 _1 Change in appetite 0 0 0 0 0  Feeling bad or failure about yourself  0 0 0 0 0  Trouble concentrating 0 0 0 0 0  Moving slowly or fidgety/restless 0 0 0 0 0  Suicidal thoughts 0 0 0 0 0  PHQ-9 Score _2 Difficult doing work/chores - - Not difficult at all Not difficult at all Somewhat difficult  Some recent data might be hidden   GAD 7 : Generalized Anxiety Score 03/08/2021 01/30/2020 11/12/2018 01/17/2017  Nervous, Anxious, on Edge 0 0 0 1  Control/stop worrying 0 0 0 0  Worry too much - different things 0 0 0 0  Trouble relaxing _3 0  Restless 0 0 0 0  Easily annoyed or irritable 0 0 0 1  Afraid - awful might happen 0 1 0 0  Total GAD 7 Score _4 Anxiety Difficulty - Not difficult at all Not difficult at all Not difficult at all        Assessment & Plan:   Problem List Items Addressed This Visit       Cardiovascular and Mediastinum   White coat syndrome with diagnosis of hypertension - Primary (Chronic)    -BP chronically elevated in office. Ambulatory BP readings stable. Will continue current medication regimen. -Discussed to continue ambulatory BP monitoring and checking BP at least 2 hours after taking medications. -Will continue to monitor.        Respiratory   Centrilobular emphysema ([place])    -Plan to discuss repeating low dose chest CT with [facility].        Other   Mixed hyperlipidemia- goal LDL less than 70 (Chronic)    -  Last lipid panel wnl's, LDL 80 (goal <70) - Continue current medication regimen.  - Patient is non-fasting so will collect fasting labs with [facility] in 2 months.       Other Visit Diagnoses     Neck pain       Relevant Medications   cyclobenzaprine (FLEXERIL) 10 MG tablet   Anxiety       Epigastric pain          Anxiety: -stable, continue nonpharmacologic therapy.  -Will continue to monitor.  Neck pain: -Patient completed PT therapy and recommend to resume HEP. Continue with topical heat/cold therapy and use muscle relaxer as needed. If symptoms fail to improve or worsen recommend obtaining imaging studies such as cervical x-ray.  Epigastric pain: -Discussed with patient likely GERD related and recommend to trial H2 blocker such as famotidine (Pepcid) when needed for acute symptoms. Recommend to avoid over-eating. If symptoms fail to improve or worsen recommend further evaluation.   Meds ordered this encounter  Medications   cyclobenzaprine (FLEXERIL) 10 MG tablet    Sig: Take 1 tablet (10 mg total) by mouth 3 (three) times daily as needed for muscle spasms.    Dispense:  30 tablet    Refill:  0    Order Specific Question:   Supervising Provider    Answer:   Beatrice Lecher D [[id number]]     Follow-up: Return in about 2 months (  around 05/08/2021) for [facility] and [facility].  2 months for [facility]   Maritza Abonza, PA-C 

## 2021-03-08 NOTE — Assessment & Plan Note (Signed)
-  Plan to discuss repeating low dose chest CT with [facility].

## 2021-03-31 ENCOUNTER — Other Ambulatory Visit: Payer: Self-pay | Admitting: Physician Assistant

## 2021-03-31 DIAGNOSIS — E782 Mixed hyperlipidemia: Secondary | ICD-10-CM

## 2021-03-31 MED ORDER — ATORVASTATIN CALCIUM 40 MG PO TABS: 40.0000 mg | ORAL_TABLET | Freq: Every day | ORAL | 0 refills | Status: DC

## 2021-05-03 ENCOUNTER — Other Ambulatory Visit: Payer: Self-pay

## 2021-05-03 ENCOUNTER — Telehealth: Payer: Self-pay | Admitting: Physician Assistant

## 2021-05-03 DIAGNOSIS — E782 Mixed hyperlipidemia: Secondary | ICD-10-CM

## 2021-05-03 MED ORDER — ATORVASTATIN CALCIUM 40 MG PO TABS: 40.0000 mg | ORAL_TABLET | Freq: Every day | ORAL | 1 refills | Status: DC

## 2021-05-03 NOTE — Telephone Encounter (Signed)
Sent refill for 90 days to [facility]. Called patient to notify.

## 2021-05-03 NOTE — Telephone Encounter (Signed)
Patient called requesting refill of cholesterol medication. Please advise. She would like it sent to [facility] call back number is [phone]

## 2021-05-17 ENCOUNTER — Other Ambulatory Visit: Payer: Self-pay

## 2021-05-17 DIAGNOSIS — E7889 Other lipoprotein metabolism disorders: Secondary | ICD-10-CM

## 2021-05-17 DIAGNOSIS — E782 Mixed hyperlipidemia: Secondary | ICD-10-CM

## 2021-05-17 DIAGNOSIS — I10 Essential (primary) hypertension: Secondary | ICD-10-CM

## 2021-05-17 DIAGNOSIS — R7302 Impaired glucose tolerance (oral): Secondary | ICD-10-CM

## 2021-05-17 DIAGNOSIS — Z79899 Other long term (current) drug therapy: Secondary | ICD-10-CM

## 2021-05-17 DIAGNOSIS — E559 Vitamin D deficiency, unspecified: Secondary | ICD-10-CM

## 2021-05-18 ENCOUNTER — Other Ambulatory Visit: Payer: Self-pay

## 2021-05-18 ENCOUNTER — Other Ambulatory Visit: Payer: Medicare HMO

## 2021-05-18 DIAGNOSIS — I10 Essential (primary) hypertension: Secondary | ICD-10-CM | POA: Diagnosis not present

## 2021-05-18 DIAGNOSIS — R7302 Impaired glucose tolerance (oral): Secondary | ICD-10-CM

## 2021-05-18 DIAGNOSIS — E559 Vitamin D deficiency, unspecified: Secondary | ICD-10-CM | POA: Diagnosis not present

## 2021-05-18 DIAGNOSIS — Z79899 Other long term (current) drug therapy: Secondary | ICD-10-CM | POA: Diagnosis not present

## 2021-05-18 DIAGNOSIS — E7889 Other lipoprotein metabolism disorders: Secondary | ICD-10-CM

## 2021-05-18 DIAGNOSIS — E782 Mixed hyperlipidemia: Secondary | ICD-10-CM | POA: Diagnosis not present

## 2021-05-19 LAB — COMPREHENSIVE METABOLIC PANEL
ALT: 14 IU/L (ref 0–32)
AST: 18 IU/L (ref 0–40)
Albumin/Globulin Ratio: 1.9 (ref 1.2–2.2)
Albumin: 4.6 g/dL (ref 3.8–4.8)
Alkaline Phosphatase: 76 IU/L (ref 44–121)
BUN/Creatinine Ratio: 16 (ref 12–28)
BUN: 14 mg/dL (ref 8–27)
Bilirubin Total: 0.5 mg/dL (ref 0.0–1.2)
CO2: 25 mmol/L (ref 20–29)
Calcium: 9.8 mg/dL (ref 8.7–10.3)
Chloride: 99 mmol/L (ref 96–106)
Creatinine, Ser: 0.85 mg/dL (ref 0.57–1.00)
Globulin, Total: 2.4 g/dL (ref 1.5–4.5)
Glucose: 95 mg/dL (ref 70–99)
Potassium: 3.5 mmol/L (ref 3.5–5.2)
Sodium: 140 mmol/L (ref 134–144)
Total Protein: 7 g/dL (ref 6.0–8.5)
eGFR: 74 mL/min/{1.73_m2} (ref >59)

## 2021-05-19 LAB — CBC WITH DIFFERENTIAL/PLATELET
Basophils Absolute: 0 10*3/uL (ref 0.0–0.2)
Basos: 1 %
EOS (ABSOLUTE): 0.1 10*3/uL (ref 0.0–0.4)
Eos: 2 %
Hematocrit: 41.7 % (ref 34.0–46.6)
Hemoglobin: 14.1 g/dL (ref 11.1–15.9)
Immature Grans (Abs): 0 10*3/uL (ref 0.0–0.1)
Immature Granulocytes: 0 %
Lymphocytes Absolute: 2.9 10*3/uL (ref 0.7–3.1)
Lymphs: 33 %
MCH: 30.9 pg (ref 26.6–33.0)
MCHC: 33.8 g/dL (ref 31.5–35.7)
MCV: 91 fL (ref 79–97)
Monocytes Absolute: 0.9 10*3/uL (ref 0.1–0.9)
Monocytes: 10 %
Neutrophils Absolute: 4.8 10*3/uL (ref 1.4–7.0)
Neutrophils: 54 %
Platelets: 414 10*3/uL (ref 150–450)
RBC: 4.56 x10E6/uL (ref 3.77–5.28)
RDW: 11.7 % (ref 11.7–15.4)
WBC: 8.8 10*3/uL (ref 3.4–10.8)

## 2021-05-19 LAB — LIPID PANEL
Chol/HDL Ratio: 1.9 ratio (ref 0.0–4.4)
Cholesterol, Total: 173 mg/dL (ref 100–199)
HDL: 92 mg/dL (ref >39)
LDL Chol Calc (NIH): 70 mg/dL (ref 0–99)
Triglycerides: 52 mg/dL (ref 0–149)
VLDL Cholesterol Cal: 11 mg/dL (ref 5–40)

## 2021-05-19 LAB — HEMOGLOBIN A1C
Est. average glucose Bld gHb Est-mCnc: 128 mg/dL
Hgb A1c MFr Bld: 6.1 % — ABNORMAL HIGH (ref 4.8–5.6)

## 2021-05-19 LAB — TSH: TSH: 2.03 u[IU]/mL (ref 0.450–4.500)

## 2021-05-26 ENCOUNTER — Ambulatory Visit (INDEPENDENT_AMBULATORY_CARE_PROVIDER_SITE_OTHER): Payer: Medicare HMO | Admitting: Physician Assistant

## 2021-05-26 ENCOUNTER — Other Ambulatory Visit: Payer: Self-pay

## 2021-05-26 ENCOUNTER — Encounter: Payer: Self-pay | Admitting: Physician Assistant

## 2021-05-26 VITALS — BP 147/82 | HR 77 | Temp 98.2°F | Ht <= 58 in | Wt 122.6 lb

## 2021-05-26 DIAGNOSIS — Z1211 Encounter for screening for malignant neoplasm of colon: Secondary | ICD-10-CM | POA: Diagnosis not present

## 2021-05-26 DIAGNOSIS — Z0000 Encounter for general adult medical examination without abnormal findings: Secondary | ICD-10-CM

## 2021-05-26 DIAGNOSIS — I10 Essential (primary) hypertension: Secondary | ICD-10-CM | POA: Diagnosis not present

## 2021-05-26 NOTE — Patient Instructions (Signed)
Preventive Care 40 Years and Older, Female Preventive care refers to lifestyle choices and visits with your health care provider that can promote health and wellness. Preventive care visits are also called wellness exams. What can I expect for my preventive care visit? Counseling Your health care provider may ask you questions about your: Medical history, including: Past medical problems. Family medical history. Pregnancy and menstrual history. History of falls. Current health, including: Memory and ability to understand (cognition). Emotional well-being. Home life and relationship well-being. Sexual activity and sexual health. Lifestyle, including: Alcohol, nicotine or tobacco, and drug use. Access to firearms. Diet, exercise, and sleep habits. Work and work Statistician. Sunscreen use. Safety issues such as seatbelt and bike helmet use. Physical exam Your health care provider will check your: Height and weight. These may be used to calculate your BMI (body mass index). BMI is a measurement that tells if you are at a healthy weight. Waist circumference. This measures the distance around your waistline. This measurement also tells if you are at a healthy weight and may help predict your risk of certain diseases, such as type 2 diabetes and high blood pressure. Heart rate and blood pressure. Body temperature. Skin for abnormal spots. What immunizations do I need? Vaccines are usually given at various ages, according to a schedule. Your health care provider will recommend vaccines for you based on your age, medical history, and lifestyle or other factors, such as travel or where you work. What tests do I need? Screening Your health care provider may recommend screening tests for certain conditions. This may include: Lipid and cholesterol levels. Hepatitis C test. Hepatitis B test. HIV (human immunodeficiency virus) test. STI (sexually transmitted infection) testing, if you are at  risk. Lung cancer screening. Colorectal cancer screening. Diabetes screening. This is done by checking your blood sugar (glucose) after you have not eaten for a while (fasting). Mammogram. Talk with your health care provider about how often you should have regular mammograms. BRCA-related cancer screening. This may be done if you have a family history of breast, ovarian, tubal, or peritoneal cancers. Bone density scan. This is done to screen for osteoporosis. Talk with your health care provider about your test results, treatment options, and if necessary, the need for more tests. Follow these instructions at home: Eating and drinking  Eat a diet that includes fresh fruits and vegetables, whole grains, lean protein, and low-fat dairy products. Limit your intake of foods with high amounts of sugar, saturated fats, and salt. Take vitamin and mineral supplements as recommended by your health care provider. Do not drink alcohol if your health care provider tells you not to drink. If you drink alcohol: Limit how much you have to 0-1 drink a day. Know how much alcohol is in your drink. In the [place] one drink equals one 12 oz bottle of beer (355 mL), one 5 oz glass of wine (148 mL), or one 1 oz glass of hard liquor (44 mL). Lifestyle Brush your teeth every morning and night with fluoride toothpaste. Floss one time each day. Exercise for at least 30 minutes 5 or more days each week. Do not use any products that contain nicotine or tobacco. These products include cigarettes, chewing tobacco, and vaping devices, such as e-cigarettes. If you need help quitting, ask your health care provider. Do not use drugs. If you are sexually active, practice safe sex. Use a condom or other form of protection in order to prevent STIs. Take aspirin only as told by your  health care provider. Make sure that you understand how much to take and what form to take. Work with your health care provider to find out whether it  is safe and beneficial for you to take aspirin daily. Ask your health care provider if you need to take a cholesterol-lowering medicine (statin). Find healthy ways to manage stress, such as: Meditation, yoga, or listening to music. Journaling. Talking to a trusted person. Spending time with friends and family. Minimize exposure to UV radiation to reduce your risk of skin cancer. Safety Always wear your seat belt while driving or riding in a vehicle. Do not drive: If you have been drinking alcohol. Do not ride with someone who has been drinking. When you are tired or distracted. While texting. If you have been using any mind-altering substances or drugs. Wear a helmet and other protective equipment during sports activities. If you have firearms in your house, make sure you follow all gun safety procedures. What's next? Visit your health care provider once a year for an annual wellness visit. Ask your health care provider how often you should have your eyes and teeth checked. Stay up to date on all vaccines. This information is not intended to replace advice given to you by your health care provider. Make sure you discuss any questions you have with your health care provider. Document Revised: 12/01/2020 Document Reviewed: 12/01/2020 Elsevier Patient Education  [place].

## 2021-05-26 NOTE — Progress Notes (Signed)
Subjective:   Wendy Wheeler is a 70 y.o. female who presents for Medicare Annual (Subsequent) preventive examination.  Review of Systems    General:   No F/C, wt loss Pulm:   No DIB, SOB, pleuritic chest pain Card:  No CP, palpitations Abd:  No n/v/d or pain Ext:  No inc edema from baseline    Objective:    Today's Vitals   05/26/21 1100 05/26/21 1133  BP: (!) 160/80 (!) 147/82  Pulse: 77   Temp: 98.2 F (36.8 C)   SpO2: 98%   Weight: 122 lb 9.6 oz (55.6 kg)   Height: 4\' 10"  (1.473 m)    Body mass index is 25.62 kg/m.  Advanced Directives 01/17/2017  Does Patient Have a Medical Advance Directive? No  Would patient like information on creating a medical advance directive? No - Patient declined    Current Medications (verified) Outpatient Encounter Medications as of 05/26/2021  Medication Sig   Ascorbic Acid (VITAMIN C) 1000 MG tablet Take 1,000 mg by mouth daily.   aspirin EC 81 MG tablet Take 81 mg by mouth daily.   atorvastatin (LIPITOR) 40 MG tablet Take 1 tablet (40 mg total) by mouth at bedtime.   cholecalciferol (VITAMIN D) 1000 units tablet Take 1,000 Units by mouth daily. Takes 3 times daily- 3,000 QD   cyclobenzaprine (FLEXERIL) 10 MG tablet Take 1 tablet (10 mg total) by mouth 3 (three) times daily as needed for muscle spasms.   hydrochlorothiazide (HYDRODIURIL) 25 MG tablet TAKE 1 TABLET EVERY DAY   Menaquinone-7 (VITAMIN K2 PO) Take 1 tablet by mouth daily.   metoprolol succinate (TOPROL-XL) 25 MG 24 hr tablet TAKE 1 TABLET EVERY DAY   Multiple Vitamins-Minerals (HAIR SKIN AND NAILS FORMULA PO) Take 1 tablet by mouth daily.   vitamin B-12 (CYANOCOBALAMIN) 1000 MCG tablet Take 1,000 mcg by mouth daily.   No facility-administered encounter medications on file as of 05/26/2021.    Allergies (verified) Aleve [naproxen sodium]   History: Past Medical History:  Diagnosis Date   Hyperlipidemia    Hypertension    History reviewed. No pertinent surgical  history. Family History  Problem Relation Age of Onset   Cancer Mother        breast   Hyperlipidemia Mother    Hypertension Mother    Hypertension Father    Hyperlipidemia Father    Depression Father        suicide   Cancer Sister        bladder   Heart disease Paternal Grandmother    Stroke Paternal Grandfather    Bladder Cancer Sister    Diabetes Brother    Cerebral palsy Brother    Social History   Socioeconomic History   Marital status: Married    Spouse name: Not on file   Number of children: Not on file   Years of education: Not on file   Highest education level: Not on file  Occupational History   Not on file  Tobacco Use   Smoking status: Former    Types: Cigarettes    Quit date: 08/22/2005    Years since quitting: 15.7   Smokeless tobacco: Never  Vaping Use   Vaping Use: Never used  Substance and Sexual Activity   Alcohol use: No   Drug use: No   Sexual activity: Yes    Partners: Male  Other Topics Concern   Not on file  Social History Narrative   Not on file   Social  Determinants of Health   Financial Resource Strain: Not on file  Food Insecurity: Not on file  Transportation Needs: Not on file  Physical Activity: Not on file  Stress: Not on file  Social Connections: Not on file    Tobacco Counseling Counseling given: Not Answered   Diabetic?No   Activities of Daily Living In your present state of health, do you have any difficulty performing the following activities: 05/26/2021 03/08/2021  Hearing? N N  Vision? N N  Difficulty concentrating or making decisions? N N  Walking or climbing stairs? N N  Dressing or bathing? N N  Doing errands, shopping? N N  Some recent data might be hidden    Patient Care Team: Lorrene Reid, PA-C as PCP - General Debara Pickett Nadean Corwin, MD as PCP - Cardiology (Cardiology) [place] any recent Medical Services you may have received from other than [facility] providers in the  past year (date may be approximate).     Assessment:   This is a routine wellness examination for [place].  Hearing/Vision screen No results found.  Dietary issues and exercise activities discussed: -Low fat and carbohydrate diet. Moderate physical activity such as 25 minutes of daily walking.   Goals Addressed   None   Depression Screen PHQ 2/9 Scores 05/26/2021 03/08/2021 09/13/2020 04/26/2020 01/30/2020 09/24/2019 05/06/2019  PHQ - 2 Score 0 0 0 0 0 0 0  PHQ- 9 Score 1 1 1 2 2 2 1     Fall Risk Fall Risk  05/26/2021 03/08/2021 09/13/2020 04/26/2020 01/30/2020  Falls in the past year? 0 0 0 0 0  Number falls in past yr: 0 0 - - -  Injury with Fall? 0 0 - - -  Risk for fall due to : No Fall Risks - - - -  Follow up Falls evaluation completed Falls evaluation completed Falls evaluation completed Falls evaluation completed Falls evaluation completed    [place]:  Any stairs in or around the home? Yes  If so, are there any without handrails? Yes  Home free of loose throw rugs in walkways, pet beds, electrical cords, etc? Yes  Adequate lighting in your home to reduce risk of falls? Yes   ASSISTIVE DEVICES UTILIZED TO PREVENT FALLS:  Life alert? No  Use of a cane, walker or w/c? No  Grab bars in the bathroom? No  Shower chair or bench in shower? No  Elevated toilet seat or a handicapped toilet? No   TIMED UP AND GO:  Was the test performed? Yes .  Length of time to ambulate 10 feet: 8 sec.   Gait steady and fast without use of assistive device  Cognitive Function:  6CIT Screen 05/26/2021 04/26/2020 04/02/2018  What Year? 0 points 0 points 0 points  What month? 0 points 0 points 0 points  What time? 0 points 0 points 0 points  Count back from 20 0 points 0 points 0 points  Months in reverse 2 points 0 points 0 points  Repeat phrase 0 points 0 points 4 points  Total Score 2 0 4    Immunizations Immunization History  Administered Date(s)  Administered   Fluad Quad(high Dose 65+) 05/09/2019   Influenza, High Dose Seasonal PF 03/20/2017   Influenza-Unspecified 05/27/2015, 05/31/2016   PFIZER(Purple Top)SARS-COV-2 Vaccination 08/09/2019, 09/02/2019, 04/03/2020    TDAP status: Due, Education has been provided regarding the importance of this vaccine. Advised may receive this vaccine at local pharmacy  or Health Dept. Aware to provide a copy of the vaccination record if obtained from local pharmacy or Health Dept. Verbalized acceptance and understanding.  Flu Vaccine status: Declined, Education has been provided regarding the importance of this vaccine but patient still declined. Advised may receive this vaccine at local pharmacy or Health Dept. Aware to provide a copy of the vaccination record if obtained from local pharmacy or Health Dept. Verbalized acceptance and understanding.  Pneumococcal vaccine status: Declined,  Education has been provided regarding the importance of this vaccine but patient still declined. Advised may receive this vaccine at local pharmacy or Health Dept. Aware to provide a copy of the vaccination record if obtained from local pharmacy or Health Dept. Verbalized acceptance and understanding.   Covid-19 vaccine status: Declined, Education has been provided regarding the importance of this vaccine but patient still declined. Advised may receive this vaccine at local pharmacy or Health Dept.or vaccine clinic. Aware to provide a copy of the vaccination record if obtained from local pharmacy or Health Dept. Verbalized acceptance and understanding.  Qualifies for Shingles Vaccine? Yes  Zostavax completed No   Shingrix Completed?: No.    Education has been provided regarding the importance of this vaccine. Patient has been advised to call insurance company to determine out of pocket expense if they have not yet received this vaccine. Advised may also receive vaccine at local pharmacy or Health Dept. Verbalized  acceptance and understanding.  Screening Tests Health Maintenance  Topic Date Due   Pneumonia Vaccine 24+ Years old (1 - PCV) Never done   TETANUS/TDAP  Never done   Zoster Vaccines- Shingrix (1 of 2) Never done   COVID-19 Vaccine (4 - Booster for Pfizer series) 05/29/2020   INFLUENZA VACCINE  01/17/2021   Fecal DNA (Cologuard)  09/11/2022   MAMMOGRAM  10/20/2022   DEXA SCAN  Completed   Hepatitis C Screening  Completed   HPV VACCINES  Aged Out    Health Maintenance  Health Maintenance Due  Topic Date Due   Pneumonia Vaccine 75+ Years old (1 - PCV) Never done   TETANUS/TDAP  Never done   Zoster Vaccines- Shingrix (1 of 2) Never done   COVID-19 Vaccine (4 - Booster for [place] series) 05/29/2020   INFLUENZA VACCINE  01/17/2021    Colorectal: Order cologuard  Mammogram status: Completed 2022. Repeat every year  Dexa: Patient declined  Lung Cancer Screening: (Low Dose CT Chest recommended if Age 50-80 years, 30 pack-year currently smoking OR have quit w/in 15years.) does not qualify.   Lung Cancer Screening Referral: n/a  Additional Screening:  Hepatitis C Screening: does qualify; Completed 05/03/2018  Vision Screening: Recommended annual ophthalmology exams for early detection of glaucoma and other disorders of the eye. Is the patient up to date with their annual eye exam?  Yes  Who is the provider or what is the name of the office in which the patient attends annual eye exams? Cant remember name If pt is not established with a provider, would they like to be referred to a provider to establish care? No .   Dental Screening: Recommended annual dental exams for proper oral hygiene  Community Resource Referral / Chronic Care Management: CRR required this visit?  No   CCM required this visit?  No      Plan:  -Discussed most recent labs which are essentially within normal limits or stable from prior. A1c has mildly increased. -BP elevated in office, hx of white coat  syndrome. Patient reports  blood pressure 127/77, pulse 77 bpm this morning.  -Continue current medication regimen. -Follow up in 4 months for HTN, HLD, mood  I have personally reviewed and noted the following in the patient's chart:   Medical and social history Use of alcohol, tobacco or illicit drugs  Current medications and supplements including opioid prescriptions.  Functional ability and status Nutritional status Physical activity Advanced directives List of other physicians Hospitalizations, surgeries, and ER visits in previous 12 months Vitals Screenings to include cognitive, depression, and falls Referrals and appointments  In addition, I have reviewed and discussed with patient certain preventive protocols, quality metrics, and best practice recommendations. A written personalized care plan for preventive services as well as general preventive health recommendations were provided to patient.

## 2021-07-30 ENCOUNTER — Other Ambulatory Visit: Payer: Self-pay | Admitting: Physician Assistant

## 2021-07-30 DIAGNOSIS — I10 Essential (primary) hypertension: Secondary | ICD-10-CM

## 2021-08-17 ENCOUNTER — Other Ambulatory Visit: Payer: Self-pay | Admitting: Physician Assistant

## 2021-08-17 DIAGNOSIS — I10 Essential (primary) hypertension: Secondary | ICD-10-CM

## 2021-08-23 ENCOUNTER — Other Ambulatory Visit: Payer: Self-pay

## 2021-08-23 DIAGNOSIS — E782 Mixed hyperlipidemia: Secondary | ICD-10-CM

## 2021-08-23 MED ORDER — ATORVASTATIN CALCIUM 40 MG PO TABS: 40.0000 mg | ORAL_TABLET | Freq: Every day | ORAL | 0 refills | Status: DC

## 2021-09-26 ENCOUNTER — Ambulatory Visit (INDEPENDENT_AMBULATORY_CARE_PROVIDER_SITE_OTHER): Payer: Medicare HMO | Admitting: Physician Assistant

## 2021-09-26 ENCOUNTER — Encounter: Payer: Self-pay | Admitting: Physician Assistant

## 2021-09-26 VITALS — BP 130/76 | HR 66 | Temp 97.9°F | Ht 58.25 in | Wt 118.0 lb

## 2021-09-26 DIAGNOSIS — F419 Anxiety disorder, unspecified: Secondary | ICD-10-CM

## 2021-09-26 DIAGNOSIS — I10 Essential (primary) hypertension: Secondary | ICD-10-CM

## 2021-09-26 DIAGNOSIS — M542 Cervicalgia: Secondary | ICD-10-CM

## 2021-09-26 DIAGNOSIS — E782 Mixed hyperlipidemia: Secondary | ICD-10-CM | POA: Diagnosis not present

## 2021-09-26 MED ORDER — CYCLOBENZAPRINE HCL 10 MG PO TABS: 10.0000 mg | ORAL_TABLET | Freq: Three times a day (TID) | ORAL | 0 refills | Status: AC | PRN

## 2021-09-26 NOTE — Progress Notes (Signed)
?Established patient visit ? ? ?Patient: Wendy Wheeler   DOB: 1950-12-02   71 y.o. Female  MRN: [id number] ?Visit Date: 09/26/2021 ? ?Chief Complaint  ?Patient presents with  ? Follow-up  ? Hypertension  ? Hyperlipidemia  ? ?Subjective  ?  ?HPI  ?Patient presents for follow-up on hypertension, hyperlipidemia and mood. Requesting refill of cyclobenzaprine which she takes when needed for neck spasms and pain.  ? ?Mood: Patient reports her cat of 15 years passed away so has felt sad and anxious but overall managing fine. Tries to do other activities that make her happy. No SI/HI. ? ?HTN: Pt denies chest pain, palpitations, dizziness, headache or lower extremity swelling. Taking medication as directed without side effects. Continues to check BP at home and readings average <130/80. ? ?HLD: Pt taking medication as directed without issues. Patient reports for lent gave up chocolate. Working on reducing sugar intake.  ? ? ?  09/26/2021  ? 10:48 AM 05/26/2021  ? 11:03 AM 03/08/2021  ?  9:49 AM 09/13/2020  ? 10:05 AM 04/26/2020  ? 11:37 AM  ?Depression screen PHQ 2/9  ?Decreased Interest 0 0 0 0 0  ?Down, Depressed, Hopeless 1 0 0 0 0  ?PHQ - 2 Score 1 0 0 0 0  ?Altered sleeping 1 0 '1 1 1  '$ ?Tired, decreased energy 0 0 0 0 1  ?Change in appetite 0 1 0 0 0  ?Feeling bad or failure about yourself  0 0 0 0 0  ?Trouble concentrating 0 0 0 0 0  ?Moving slowly or fidgety/restless 0 0 0 0 0  ?Suicidal thoughts 0 0 0 0 0  ?PHQ-9 Score '2 1 1 1 2  '$ ?Difficult doing work/chores Not difficult at all Not difficult at all   Not difficult at all  ? ? ?  09/26/2021  ? 10:48 AM 05/26/2021  ? 11:04 AM 03/08/2021  ?  9:49 AM 01/30/2020  ?  9:09 AM  ?GAD 7 : Generalized Anxiety Score  ?Nervous, Anxious, on Edge 1 0 0 0  ?Control/stop worrying 1 0 0 0  ?Worry too much - different things 1 0 0 0  ?Trouble relaxing 1 0 1 1  ?Restless 0 0 0 0  ?Easily annoyed or irritable 0 0 0 0  ?Afraid - awful might happen 0 0 0 1  ?Total GAD 7 Score 4 0 1 2  ?Anxiety  Difficulty Somewhat difficult   Not difficult at all  ? ? ? ? ?Medications: ?Outpatient Medications Prior to Visit  ?Medication Sig  ? aspirin EC 81 MG tablet Take 81 mg by mouth daily.  ? atorvastatin (LIPITOR) 40 MG tablet Take 1 tablet (40 mg total) by mouth at bedtime.  ? hydrochlorothiazide (HYDRODIURIL) 25 MG tablet TAKE 1 TABLET EVERY DAY  ? metoprolol succinate (TOPROL-XL) 25 MG 24 hr tablet TAKE 1 TABLET EVERY DAY  ? [DISCONTINUED] cyclobenzaprine (FLEXERIL) 10 MG tablet Take 1 tablet (10 mg total) by mouth 3 (three) times daily as needed for muscle spasms.  ? Ascorbic Acid (VITAMIN C) 1000 MG tablet Take 1,000 mg by mouth daily. (Patient not taking: Reported on 09/26/2021)  ? cholecalciferol (VITAMIN D) 1000 units tablet Take 1,000 Units by mouth daily. Takes 3 times daily- 3,000 QD (Patient not taking: Reported on 09/26/2021)  ? Menaquinone-7 (VITAMIN K2 PO) Take 1 tablet by mouth daily. (Patient not taking: Reported on 09/26/2021)  ? Multiple Vitamins-Minerals (HAIR SKIN AND NAILS FORMULA PO) Take 1 tablet by mouth daily. (  Patient not taking: Reported on 09/26/2021)  ? vitamin B-12 (CYANOCOBALAMIN) 1000 MCG tablet Take 1,000 mcg by mouth daily. (Patient not taking: Reported on 09/26/2021)  ? ?No facility-administered medications prior to visit.  ? ? ?Review of Systems ?Review of Systems:  ?A fourteen system review of systems was performed and found to be positive as per HPI. ? ? ?  Objective  ?  ?BP 130/76   Pulse 66   Temp 97.9 ?F (36.6 ?C)   Ht 4' 10.25" (1.48 m)   Wt 118 lb (53.5 kg)   SpO2 98%   BMI 24.45 kg/m?  ?BP Readings from Last 3 Encounters:  ?09/26/21 130/76  ?05/26/21 (!) 147/82  ?03/08/21 (!) 156/73  ? ?Wt Readings from Last 3 Encounters:  ?09/26/21 118 lb (53.5 kg)  ?05/26/21 122 lb 9.6 oz (55.6 kg)  ?03/08/21 119 lb 9.6 oz (54.3 kg)  ? ? ?Physical Exam  ?General:  Pleasant and cooperative, appropriate for stated age.  ?Neuro:  Alert and oriented,  extra-ocular muscles intact  ?HEENT:   Normocephalic, atraumatic, neck supple  ?Skin:  no gross rash, warm, pink. ?Cardiac:  RRR, S1 S2 ?Respiratory: CTA B/L   ?Vascular:  Ext warm, no cyanosis apprec.; cap RF less 2 sec. ?Psych:  No HI/SI, judgement and insight good, Euthymic mood. Full Affect. ? ? ?No results found for any visits on 09/26/21. ? Assessment & Plan  ?  ? ? ?Problem List Items Addressed This Visit   ? ?  ? Cardiovascular and Mediastinum  ? White coat syndrome with diagnosis of hypertension (Chronic)  ?  -Stable. BP repeated on intake which improved. Ambulatory BP readings stable. Continue current medication regimen. Will continue to monitor. ?  ?  ?  ? Other  ? Mixed hyperlipidemia- goal LDL less than 70 - Primary (Chronic)  ?  -Last lipid panel: HDL 92, LDL 70. ?-Continue current medication regimen. ?-Will repeat lipid panel and hepatic function at f/up visit or before cardiology f/up to get updated labs. ?-Will continue to monitor.  ?  ?  ? ?Other Visit Diagnoses   ? ? Anxiety      ? Neck pain      ? Relevant Medications  ? cyclobenzaprine (FLEXERIL) 10 MG tablet  ? ?  ? ?Anxiety: ?-GAD-7 score mildly above baseline most likely secondary to acute stress reaction. Discussed to continue with mindfulness therapy and use good support system at home. Will continue to monitor. ? ? ?Return in about 4 months (around 01/26/2022) for HTN, HLD, prediabetes; pt will call to schedule lab visit for FBW.  ?   ? ? ? ?Lorrene Reid, PA-C  ?[facility] ?[phone] (phone) ?[phone] (fax) ? ?[facility] ?

## 2021-09-26 NOTE — Patient Instructions (Signed)

## 2021-09-26 NOTE — Assessment & Plan Note (Addendum)
-  Last lipid panel: HDL 92, LDL 70. ?-Continue current medication regimen. ?-Will repeat lipid panel and hepatic function at f/up visit or before cardiology f/up to get updated labs. ?-Will continue to monitor.  ?

## 2021-09-26 NOTE — Assessment & Plan Note (Signed)
-  Stable. BP repeated on intake which improved. Ambulatory BP readings stable. Continue current medication regimen. Will continue to monitor. ?

## 2021-11-14 ENCOUNTER — Other Ambulatory Visit: Payer: Self-pay | Admitting: Physician Assistant

## 2021-11-14 DIAGNOSIS — E782 Mixed hyperlipidemia: Secondary | ICD-10-CM

## 2021-11-30 ENCOUNTER — Other Ambulatory Visit: Payer: Self-pay | Admitting: Physician Assistant

## 2021-11-30 DIAGNOSIS — Z1231 Encounter for screening mammogram for malignant neoplasm of breast: Secondary | ICD-10-CM

## 2021-12-14 ENCOUNTER — Ambulatory Visit: Payer: Medicare HMO

## 2021-12-16 ENCOUNTER — Ambulatory Visit
Admission: RE | Admit: 2021-12-16 | Discharge: 2021-12-16 | Disposition: A | Discharge status: Home / Self Care | Payer: Medicare HMO | Source: Ambulatory Visit | Attending: Physician Assistant | Admitting: Physician Assistant

## 2021-12-16 DIAGNOSIS — Z1231 Encounter for screening mammogram for malignant neoplasm of breast: Secondary | ICD-10-CM | POA: Diagnosis not present

## 2022-01-02 DIAGNOSIS — H35453 Secondary pigmentary degeneration, bilateral: Secondary | ICD-10-CM | POA: Diagnosis not present

## 2022-01-02 DIAGNOSIS — Q141 Congenital malformation of retina: Secondary | ICD-10-CM | POA: Diagnosis not present

## 2022-01-02 DIAGNOSIS — H35433 Paving stone degeneration of retina, bilateral: Secondary | ICD-10-CM | POA: Diagnosis not present

## 2022-01-02 DIAGNOSIS — H25813 Combined forms of age-related cataract, bilateral: Secondary | ICD-10-CM | POA: Diagnosis not present

## 2022-01-02 DIAGNOSIS — H35372 Puckering of macula, left eye: Secondary | ICD-10-CM | POA: Diagnosis not present

## 2022-01-18 ENCOUNTER — Other Ambulatory Visit: Payer: Self-pay | Admitting: Physician Assistant

## 2022-01-18 DIAGNOSIS — I10 Essential (primary) hypertension: Secondary | ICD-10-CM

## 2022-01-25 ENCOUNTER — Ambulatory Visit (INDEPENDENT_AMBULATORY_CARE_PROVIDER_SITE_OTHER): Payer: Medicare HMO | Admitting: Physician Assistant

## 2022-01-25 ENCOUNTER — Encounter: Payer: Self-pay | Admitting: Physician Assistant

## 2022-01-25 ENCOUNTER — Other Ambulatory Visit: Payer: Self-pay

## 2022-01-25 VITALS — BP 145/78 | HR 72 | Ht 58.27 in | Wt 114.0 lb

## 2022-01-25 DIAGNOSIS — E782 Mixed hyperlipidemia: Secondary | ICD-10-CM | POA: Diagnosis not present

## 2022-01-25 DIAGNOSIS — B070 Plantar wart: Secondary | ICD-10-CM

## 2022-01-25 DIAGNOSIS — I10 Essential (primary) hypertension: Secondary | ICD-10-CM | POA: Diagnosis not present

## 2022-01-25 DIAGNOSIS — Z0000 Encounter for general adult medical examination without abnormal findings: Secondary | ICD-10-CM

## 2022-01-25 DIAGNOSIS — E7889 Other lipoprotein metabolism disorders: Secondary | ICD-10-CM

## 2022-01-25 DIAGNOSIS — R7302 Impaired glucose tolerance (oral): Secondary | ICD-10-CM

## 2022-01-25 DIAGNOSIS — Z79899 Other long term (current) drug therapy: Secondary | ICD-10-CM

## 2022-01-25 DIAGNOSIS — L819 Disorder of pigmentation, unspecified: Secondary | ICD-10-CM

## 2022-01-25 NOTE — Patient Instructions (Signed)

## 2022-01-25 NOTE — Assessment & Plan Note (Signed)
-  Last A1c 6.1, will repeat A1c with lab visit tomorrow. Recommend to continue with diet changes including low carbohydrates and glucose. Will continue to monitor.

## 2022-01-25 NOTE — Assessment & Plan Note (Signed)
-  Elevated BP in office secondary to white coat syndrome. Ambulatory BP readings have been at goal <140/90. Continue current medication regimen. Will continue to monitor.

## 2022-01-25 NOTE — Progress Notes (Signed)
Established patient visit   Patient: Wendy Wheeler   DOB: 1951-02-18   71 y.o. Female  MRN: [id number] Visit Date: 01/25/2022  Chief Complaint  Patient presents with   Follow-up   Subjective    HPI  Patient presents for chronic follow-up. Patient reports has a skin bump over right forearm with sudden appearance and seems to be getting bigger.   Prediabetes: No increased urination or thirst. Patient reports has worked on limiting sugars.   HTN: Pt denies chest pain, palpitations, dizziness, headache or lower extremity swelling. Taking medication as directed without side effects. Checks BP at home few times/wk and readings range in 123-128/70-80.   HLD: Pt taking medication as directed without issues. Tries to have a balanced diet.   Medications: Outpatient Medications Prior to Visit  Medication Sig   aspirin EC 81 MG tablet Take 81 mg by mouth daily.   atorvastatin (LIPITOR) 40 MG tablet TAKE 1 TABLET AT BEDTIME   cyclobenzaprine (FLEXERIL) 10 MG tablet Take 1 tablet (10 mg total) by mouth 3 (three) times daily as needed for muscle spasms.   hydrochlorothiazide (HYDRODIURIL) 25 MG tablet TAKE 1 TABLET EVERY DAY   metoprolol succinate (TOPROL-XL) 25 MG 24 hr tablet TAKE 1 TABLET EVERY DAY   Ascorbic Acid (VITAMIN C) 1000 MG tablet Take 1,000 mg by mouth daily. (Patient not taking: Reported on 09/26/2021)   cholecalciferol (VITAMIN D) 1000 units tablet Take 1,000 Units by mouth daily. Takes 3 times daily- 3,000 QD (Patient not taking: Reported on 09/26/2021)   Menaquinone-7 (VITAMIN K2 PO) Take 1 tablet by mouth daily. (Patient not taking: Reported on 09/26/2021)   Multiple Vitamins-Minerals (HAIR SKIN AND NAILS FORMULA PO) Take 1 tablet by mouth daily. (Patient not taking: Reported on 09/26/2021)   vitamin B-12 (CYANOCOBALAMIN) 1000 MCG tablet Take 1,000 mcg by mouth daily. (Patient not taking: Reported on 09/26/2021)   No facility-administered medications prior to visit.    Review  of Systems Review of Systems:  A fourteen system review of systems was performed and found to be positive as per HPI.  Last CBC Lab Results  Component Value Date   WBC 8.8 05/18/2021   HGB 14.1 05/18/2021   HCT 41.7 05/18/2021   MCV 91 05/18/2021   MCH 30.9 05/18/2021   RDW 11.7 05/18/2021   PLT 414 40/03/2724   Last metabolic panel Lab Results  Component Value Date   GLUCOSE 95 05/18/2021   NA 140 05/18/2021   K 3.5 05/18/2021   CL 99 05/18/2021   CO2 25 05/18/2021   BUN 14 05/18/2021   CREATININE 0.85 05/18/2021   EGFR 74 05/18/2021   CALCIUM 9.8 05/18/2021   PROT 7.0 05/18/2021   ALBUMIN 4.6 05/18/2021   LABGLOB 2.4 05/18/2021   AGRATIO 1.9 05/18/2021   BILITOT 0.5 05/18/2021   ALKPHOS 76 05/18/2021   AST 18 05/18/2021   ALT 14 05/18/2021   Last lipids Lab Results  Component Value Date   CHOL 173 05/18/2021   HDL 92 05/18/2021   LDLCALC 70 05/18/2021   TRIG 52 05/18/2021   CHOLHDL 1.9 05/18/2021   Last hemoglobin A1c Lab Results  Component Value Date   HGBA1C 6.1 (H) 05/18/2021   Last thyroid functions Lab Results  Component Value Date   TSH 2.030 05/18/2021   Last vitamin D Lab Results  Component Value Date   VD25OH 50.3 04/23/2020       Objective    BP (!) 145/78   Pulse 72  Ht 4' 10.27" (1.48 m)   Wt 114 lb (51.7 kg)   SpO2 98%   BMI 23.61 kg/m  BP Readings from Last 3 Encounters:  01/25/22 (!) 145/78  09/26/21 130/76  05/26/21 (!) 147/82   Wt Readings from Last 3 Encounters:  01/25/22 114 lb (51.7 kg)  09/26/21 118 lb (53.5 kg)  05/26/21 122 lb 9.6 oz (55.6 kg)    Physical Exam  General:  Well Developed, well nourished, appropriate for stated age.  Neuro:  Alert and oriented,  extra-ocular muscles intact  HEENT:  Normocephalic, atraumatic, neck supple  Skin:  scaly growth with red base over lateral right forearm Cardiac:  RRR, S1 S2 Respiratory: CTA B/L  Vascular:  Ext warm, no cyanosis apprec.; cap RF less 2 sec. Psych:   No HI/SI, judgement and insight good, Euthymic mood. Full Affect.   No results found for any visits on 01/25/22.  Assessment & Plan      Problem List Items Addressed This Visit       Cardiovascular and Mediastinum   White coat syndrome with diagnosis of hypertension - Primary (Chronic)    -Elevated BP in office secondary to white coat syndrome. Ambulatory BP readings have been at goal <140/90. Continue current medication regimen. Will continue to monitor.        Endocrine   Glucose intolerance (impaired glucose tolerance)    -Last A1c 6.1, will repeat A1c with lab visit tomorrow. Recommend to continue with diet changes including low carbohydrates and glucose. Will continue to monitor.        Other   Mixed hyperlipidemia- goal LDL less than 70 (Chronic)    -Last lipid panel: LDL 70, goal <70. Advised patient to schedule lab visit for fasting labs including lipid panel and hepatic function tomorrow. Continue current medication regimen. Recommend to follow a low fat diet. Will continue to monitor.      Other Visit Diagnoses     Atypical pigmented skin lesion       Relevant Orders   Ambulatory referral to Dermatology   Plantar verruca          Atypical pigmented skin lesion: -Will place dermatology referral.   Plantar verruca: -Discussed management options and recommend to restart over-the-counter wart treatment with salicylic acid.  Return in about 4 months (around 05/27/2022) for Medicare wellness; lab visit tomorrow for fasting labs.        Lorrene Reid, PA-C  [facility] [phone] (phone) [phone] (fax)  [place]

## 2022-01-25 NOTE — Assessment & Plan Note (Signed)
-  Last lipid panel: LDL 70, goal <70. Advised patient to schedule lab visit for fasting labs including lipid panel and hepatic function tomorrow. Continue current medication regimen. Recommend to follow a low fat diet. Will continue to monitor.

## 2022-01-26 ENCOUNTER — Other Ambulatory Visit: Payer: Medicare HMO

## 2022-01-26 DIAGNOSIS — E7889 Other lipoprotein metabolism disorders: Secondary | ICD-10-CM

## 2022-01-26 DIAGNOSIS — E782 Mixed hyperlipidemia: Secondary | ICD-10-CM

## 2022-01-26 DIAGNOSIS — Z0000 Encounter for general adult medical examination without abnormal findings: Secondary | ICD-10-CM

## 2022-01-26 DIAGNOSIS — I10 Essential (primary) hypertension: Secondary | ICD-10-CM

## 2022-01-26 DIAGNOSIS — R7302 Impaired glucose tolerance (oral): Secondary | ICD-10-CM

## 2022-01-26 DIAGNOSIS — Z79899 Other long term (current) drug therapy: Secondary | ICD-10-CM | POA: Diagnosis not present

## 2022-01-27 LAB — COMPREHENSIVE METABOLIC PANEL
ALT: 18 IU/L (ref 0–32)
AST: 24 IU/L (ref 0–40)
Albumin/Globulin Ratio: 2.1 (ref 1.2–2.2)
Albumin: 4.4 g/dL (ref 3.9–4.9)
Alkaline Phosphatase: 76 IU/L (ref 44–121)
BUN/Creatinine Ratio: 19 (ref 12–28)
BUN: 14 mg/dL (ref 8–27)
Bilirubin Total: 0.7 mg/dL (ref 0.0–1.2)
CO2: 26 mmol/L (ref 20–29)
Calcium: 9.6 mg/dL (ref 8.7–10.3)
Chloride: 95 mmol/L — ABNORMAL LOW (ref 96–106)
Creatinine, Ser: 0.74 mg/dL (ref 0.57–1.00)
Globulin, Total: 2.1 g/dL (ref 1.5–4.5)
Glucose: 97 mg/dL (ref 70–99)
Potassium: 3.3 mmol/L — ABNORMAL LOW (ref 3.5–5.2)
Sodium: 138 mmol/L (ref 134–144)
Total Protein: 6.5 g/dL (ref 6.0–8.5)
eGFR: 88 mL/min/{1.73_m2} (ref >59)

## 2022-01-27 LAB — HEMOGLOBIN A1C
Est. average glucose Bld gHb Est-mCnc: 120 mg/dL
Hgb A1c MFr Bld: 5.8 % — ABNORMAL HIGH (ref 4.8–5.6)

## 2022-01-27 LAB — LIPID PANEL
Chol/HDL Ratio: 2 ratio (ref 0.0–4.4)
Cholesterol, Total: 155 mg/dL (ref 100–199)
HDL: 79 mg/dL (ref >39)
LDL Chol Calc (NIH): 65 mg/dL (ref 0–99)
Triglycerides: 50 mg/dL (ref 0–149)
VLDL Cholesterol Cal: 11 mg/dL (ref 5–40)

## 2022-01-27 LAB — CBC WITH DIFFERENTIAL/PLATELET
Basophils Absolute: 0 10*3/uL (ref 0.0–0.2)
Basos: 0 %
EOS (ABSOLUTE): 0.1 10*3/uL (ref 0.0–0.4)
Eos: 1 %
Hematocrit: 39.8 % (ref 34.0–46.6)
Hemoglobin: 13.8 g/dL (ref 11.1–15.9)
Immature Grans (Abs): 0 10*3/uL (ref 0.0–0.1)
Immature Granulocytes: 0 %
Lymphocytes Absolute: 3.2 10*3/uL — ABNORMAL HIGH (ref 0.7–3.1)
Lymphs: 34 %
MCH: 31.2 pg (ref 26.6–33.0)
MCHC: 34.7 g/dL (ref 31.5–35.7)
MCV: 90 fL (ref 79–97)
Monocytes Absolute: 0.8 10*3/uL (ref 0.1–0.9)
Monocytes: 8 %
Neutrophils Absolute: 5.2 10*3/uL (ref 1.4–7.0)
Neutrophils: 57 %
Platelets: 405 10*3/uL (ref 150–450)
RBC: 4.43 x10E6/uL (ref 3.77–5.28)
RDW: 11.5 % — ABNORMAL LOW (ref 11.7–15.4)
WBC: 9.4 10*3/uL (ref 3.4–10.8)

## 2022-01-27 LAB — TSH: TSH: 1.74 u[IU]/mL (ref 0.450–4.500)

## 2022-03-20 DIAGNOSIS — H25013 Cortical age-related cataract, bilateral: Secondary | ICD-10-CM | POA: Diagnosis not present

## 2022-03-20 DIAGNOSIS — H33322 Round hole, left eye: Secondary | ICD-10-CM | POA: Diagnosis not present

## 2022-03-20 DIAGNOSIS — H52223 Regular astigmatism, bilateral: Secondary | ICD-10-CM | POA: Diagnosis not present

## 2022-03-20 DIAGNOSIS — H5213 Myopia, bilateral: Secondary | ICD-10-CM | POA: Diagnosis not present

## 2022-03-20 DIAGNOSIS — H524 Presbyopia: Secondary | ICD-10-CM | POA: Diagnosis not present

## 2022-03-20 DIAGNOSIS — H5231 Anisometropia: Secondary | ICD-10-CM | POA: Diagnosis not present

## 2022-03-20 DIAGNOSIS — Z0100 Encounter for examination of eyes and vision without abnormal findings: Secondary | ICD-10-CM | POA: Diagnosis not present

## 2022-03-20 DIAGNOSIS — H2513 Age-related nuclear cataract, bilateral: Secondary | ICD-10-CM | POA: Diagnosis not present

## 2022-03-21 DIAGNOSIS — H33322 Round hole, left eye: Secondary | ICD-10-CM | POA: Diagnosis not present

## 2022-03-21 DIAGNOSIS — H25041 Posterior subcapsular polar age-related cataract, right eye: Secondary | ICD-10-CM | POA: Diagnosis not present

## 2022-03-21 DIAGNOSIS — H25812 Combined forms of age-related cataract, left eye: Secondary | ICD-10-CM | POA: Diagnosis not present

## 2022-03-21 DIAGNOSIS — H35411 Lattice degeneration of retina, right eye: Secondary | ICD-10-CM | POA: Diagnosis not present

## 2022-03-21 DIAGNOSIS — H35433 Paving stone degeneration of retina, bilateral: Secondary | ICD-10-CM | POA: Diagnosis not present

## 2022-03-21 DIAGNOSIS — H35373 Puckering of macula, bilateral: Secondary | ICD-10-CM | POA: Diagnosis not present

## 2022-03-27 ENCOUNTER — Other Ambulatory Visit: Payer: Self-pay | Admitting: Physician Assistant

## 2022-03-27 DIAGNOSIS — I10 Essential (primary) hypertension: Secondary | ICD-10-CM

## 2022-03-30 DIAGNOSIS — E7800 Pure hypercholesterolemia, unspecified: Secondary | ICD-10-CM | POA: Diagnosis not present

## 2022-03-30 DIAGNOSIS — E559 Vitamin D deficiency, unspecified: Secondary | ICD-10-CM | POA: Diagnosis not present

## 2022-03-30 DIAGNOSIS — I10 Essential (primary) hypertension: Secondary | ICD-10-CM | POA: Diagnosis not present

## 2022-03-30 DIAGNOSIS — R7302 Impaired glucose tolerance (oral): Secondary | ICD-10-CM | POA: Diagnosis not present

## 2022-04-04 DIAGNOSIS — H33322 Round hole, left eye: Secondary | ICD-10-CM | POA: Diagnosis not present

## 2022-04-17 ENCOUNTER — Other Ambulatory Visit: Payer: Self-pay | Admitting: Physician Assistant

## 2022-04-17 DIAGNOSIS — E782 Mixed hyperlipidemia: Secondary | ICD-10-CM

## 2022-05-17 DIAGNOSIS — E559 Vitamin D deficiency, unspecified: Secondary | ICD-10-CM | POA: Diagnosis not present

## 2022-05-17 DIAGNOSIS — Z0000 Encounter for general adult medical examination without abnormal findings: Secondary | ICD-10-CM | POA: Diagnosis not present

## 2022-05-17 DIAGNOSIS — R7301 Impaired fasting glucose: Secondary | ICD-10-CM | POA: Diagnosis not present

## 2022-05-19 DIAGNOSIS — H16142 Punctate keratitis, left eye: Secondary | ICD-10-CM | POA: Diagnosis not present

## 2022-05-19 DIAGNOSIS — H524 Presbyopia: Secondary | ICD-10-CM | POA: Diagnosis not present

## 2022-05-19 DIAGNOSIS — H2513 Age-related nuclear cataract, bilateral: Secondary | ICD-10-CM | POA: Diagnosis not present

## 2022-05-19 DIAGNOSIS — H52223 Regular astigmatism, bilateral: Secondary | ICD-10-CM | POA: Diagnosis not present

## 2022-05-19 DIAGNOSIS — H5231 Anisometropia: Secondary | ICD-10-CM | POA: Diagnosis not present

## 2022-05-19 DIAGNOSIS — H33322 Round hole, left eye: Secondary | ICD-10-CM | POA: Diagnosis not present

## 2022-05-19 DIAGNOSIS — H5213 Myopia, bilateral: Secondary | ICD-10-CM | POA: Diagnosis not present

## 2022-05-19 DIAGNOSIS — H25013 Cortical age-related cataract, bilateral: Secondary | ICD-10-CM | POA: Diagnosis not present

## 2022-05-24 DIAGNOSIS — Z0000 Encounter for general adult medical examination without abnormal findings: Secondary | ICD-10-CM | POA: Diagnosis not present

## 2022-05-24 DIAGNOSIS — E7800 Pure hypercholesterolemia, unspecified: Secondary | ICD-10-CM | POA: Diagnosis not present

## 2022-05-24 DIAGNOSIS — R7302 Impaired glucose tolerance (oral): Secondary | ICD-10-CM | POA: Diagnosis not present

## 2022-05-24 DIAGNOSIS — E559 Vitamin D deficiency, unspecified: Secondary | ICD-10-CM | POA: Diagnosis not present

## 2022-05-24 DIAGNOSIS — Z01419 Encounter for gynecological examination (general) (routine) without abnormal findings: Secondary | ICD-10-CM | POA: Diagnosis not present

## 2022-05-24 DIAGNOSIS — I10 Essential (primary) hypertension: Secondary | ICD-10-CM | POA: Diagnosis not present

## 2022-05-31 ENCOUNTER — Encounter: Payer: Medicare HMO | Admitting: Physician Assistant

## 2022-06-20 ENCOUNTER — Ambulatory Visit: Payer: Medicare HMO | Attending: Physician Assistant | Admitting: Physician Assistant

## 2022-06-20 ENCOUNTER — Encounter: Payer: Self-pay | Admitting: Physician Assistant

## 2022-06-20 VITALS — BP 159/86 | HR 80 | Ht <= 58 in | Wt 115.0 lb

## 2022-06-20 DIAGNOSIS — I10 Essential (primary) hypertension: Secondary | ICD-10-CM

## 2022-06-20 DIAGNOSIS — J432 Centrilobular emphysema: Secondary | ICD-10-CM | POA: Diagnosis not present

## 2022-06-20 DIAGNOSIS — E782 Mixed hyperlipidemia: Secondary | ICD-10-CM | POA: Diagnosis not present

## 2022-06-20 DIAGNOSIS — I2510 Atherosclerotic heart disease of native coronary artery without angina pectoris: Secondary | ICD-10-CM | POA: Diagnosis not present

## 2022-06-20 DIAGNOSIS — I2584 Coronary atherosclerosis due to calcified coronary lesion: Secondary | ICD-10-CM

## 2022-06-20 NOTE — Progress Notes (Signed)
Cardiology Office Note:    Date:  02/21/7095   ID:  Wendy Wheeler, DOB June 11, 1951, MRN [id number]  PCP:  Lorrene Reid, [place] [facility] Cardiologist:  Pixie Casino, MD     Referring MD: Lorrene Reid, PA-C   Chief Complaint  Patient presents with   Follow-up    Seen for Dr. Debara Pickett    History of Present Illness:    Wendy Wheeler is a 72 y.o. female with a hx of coronary artery calcification, hypertension and hyperlipidemia.  She is a former smoker who quit smoking around 2010.  She was previously referred to cardiology service after her lung screening CT revealed coronary artery calcification in the LAD territory.  She however was asymptomatic and denied any chest pain.  She was placed on Lipitor for her elevated LDL with LDL goal of less than 70.  CT scan in December 2020 did reveal central lobar emphysema.  It was also recorded that she may have whitecoat hypertension as well.  She was last seen by Dr. Debara Pickett in August 2022, blood pressure at the time was 172/88.  Patient presents today for follow-up.  In the past 2 months, she occasionally would have a twinge in the chest, this does not occur with physical exertion and only last a few seconds at a time.  Symptom has not shown any increasing frequency, duration or intensity.  We will continue to observe at this time.  She is aware to contact us if her symptoms does increase in frequency, duration, intensity or become more associated with physical activity.  She has started on Silver sneakers program to exercise and has been doing well.  She climb stairs up and down at home as well.  She denies any lower extremity edema, orthopnea or PND.  EKG is unchanged.  She can follow-up in 1 year, earlier if there is any change in her overall symptom.   Past Medical History:  Diagnosis Date   Hyperlipidemia    Hypertension     History reviewed. No pertinent surgical history.  Current Medications: Current  Meds  Medication Sig   aspirin EC 81 MG tablet Take 81 mg by mouth daily.   atorvastatin (LIPITOR) 40 MG tablet TAKE 1 TABLET AT BEDTIME   cyclobenzaprine (FLEXERIL) 10 MG tablet Take 1 tablet (10 mg total) by mouth 3 (three) times daily as needed for muscle spasms.   hydrochlorothiazide (HYDRODIURIL) 25 MG tablet TAKE 1 TABLET EVERY DAY   metoprolol succinate (TOPROL-XL) 25 MG 24 hr tablet TAKE 1 TABLET EVERY DAY     Allergies:   Aleve [naproxen sodium]   Social History   Socioeconomic History   Marital status: Married    Spouse name: Not on file   Number of children: Not on file   Years of education: Not on file   Highest education level: Not on file  Occupational History   Not on file  Tobacco Use   Smoking status: Former    Types: Cigarettes    Quit date: 08/22/2005    Years since quitting: 16.8   Smokeless tobacco: Never  Vaping Use   Vaping Use: Never used  Substance and Sexual Activity   Alcohol use: No   Drug use: No   Sexual activity: Yes    Partners: Male  Other Topics Concern   Not on file  Social History Narrative   Not on file   Social Determinants of Health   Financial Resource Strain: Not  on file  Food Insecurity: Not on file  Transportation Needs: Not on file  Physical Activity: Not on file  Stress: Not on file  Social Connections: Not on file     Family History: The patient's family history includes Bladder Cancer in her sister; Cancer in her mother and sister; Cerebral palsy in her brother; Depression in her father; Diabetes in her brother; Heart disease in her paternal grandmother; Hyperlipidemia in her father and mother; Hypertension in her father and mother; Stroke in her paternal grandfather.  ROS:   Please see the history of present illness.     All other systems reviewed and are negative.  EKGs/Labs/Other Studies Reviewed:    The following studies were reviewed today:  N/A  EKG:  EKG is ordered today.  The ekg ordered today  demonstrates normal sinus rhythm, no significant ST-T wave changes.  Recent Labs: 01/26/2022: ALT 18; BUN 14; Creatinine, Ser 0.74; Hemoglobin 13.8; Platelets 405; Potassium 3.3; Sodium 138; TSH 1.740  Recent Lipid Panel    Component Value Date/Time   CHOL 155 01/26/2022 1027   TRIG 50 01/26/2022 1027   HDL 79 01/26/2022 1027   CHOLHDL 2.0 01/26/2022 1027   LDLCALC 65 01/26/2022 1027     Risk Assessment/Calculations:           Physical Exam:    VS:  BP (!) 159/86   Pulse 80   Ht '4\' 10"'$  (1.473 m)   Wt 115 lb (52.2 kg)   SpO2 97%   BMI 24.04 kg/m        Wt Readings from Last 3 Encounters:  06/20/22 115 lb (52.2 kg)  01/25/22 114 lb (51.7 kg)  09/26/21 118 lb (53.5 kg)     GEN:  Well nourished, well developed in no acute distress HEENT: Normal NECK: No JVD; No carotid bruits LYMPHATICS: No lymphadenopathy CARDIAC: RRR, no murmurs, rubs, gallops RESPIRATORY:  Clear to auscultation without rales, wheezing or rhonchi  ABDOMEN: Soft, non-tender, non-distended MUSCULOSKELETAL:  No edema; No deformity  SKIN: Warm and dry NEUROLOGIC:  Alert and oriented x 3 PSYCHIATRIC:  Normal affect   ASSESSMENT:    1. Coronary artery calcification   2. Essential hypertension   3. Mixed hyperlipidemia- goal LDL less than 70    PLAN:    In order of problems listed above:  Coronary artery calcification: Denies any recent chest pain.  Hypertension: Blood pressure elevated today.  Patient has a history of whitecoat hypertension, blood pressure has been normal at home.  I asked her to continue to monitor blood pressure at home, if systolic blood pressures persistently over 140 mmHg, she has been instructed to contact cardiology service.  Hyperlipidemia: On Lipitor           Medication Adjustments/Labs and Tests Ordered: Current medicines are reviewed at length with the patient today.  Concerns regarding medicines are outlined above.  Orders Placed This Encounter  Procedures    EKG 12-Lead   No orders of the defined types were placed in this encounter.   Patient Instructions  Medication Instructions:   Your physician recommends that you continue on your current medications as directed. Please refer to the Current Medication list given to you today.  *If you need a refill on your cardiac medications before your next appointment, please call your pharmacy*  Lab Work: NONE ordered at this time of appointment   If you have labs (blood work) drawn today and your tests are completely normal, you will receive your results only  by: MyChart Message (if you have MyChart) OR A paper copy in the mail If you have any lab test that is abnormal or we need to change your treatment, we will call you to review the results.  Testing/Procedures: NONE ordered at this time of appointment   Follow-Up: At [facility], you and your health needs are our priority.  As part of our continuing mission to provide you with exceptional heart care, we have created designated Provider Care Teams.  These Care Teams include your primary Cardiologist (physician) and Advanced Practice Providers (APPs -  Physician Assistants and Nurse Practitioners) who all work together to provide you with the care you need, when you need it.  We recommend signing up for the patient portal called "MyChart".  Sign up information is provided on this After Visit Summary.  MyChart is used to connect with patients for Virtual Visits (Telemedicine).  Patients are able to view lab/test results, encounter notes, upcoming appointments, etc.  Non-urgent messages can be sent to your provider as well.   To learn more about what you can do with MyChart, go to [email or web address].    Your next appointment:   1 year(s)  The format for your next appointment:   In Person  Provider:   Pixie Casino, MD     Other Instructions Monitor chest discomfort, if becomes more frequent, intense associated with  exercise please give our office a call.   Important Information About [place]         Hilbert Corrigan, [place]  06/20/2022 9:13 PM    [place]

## 2022-06-20 NOTE — Patient Instructions (Addendum)
Medication Instructions:   Your physician recommends that you continue on your current medications as directed. Please refer to the Current Medication list given to you today.  *If you need a refill on your cardiac medications before your next appointment, please call your pharmacy*  Lab Work: NONE ordered at this time of appointment   If you have labs (blood work) drawn today and your tests are completely normal, you will receive your results only by: [place] (if you have MyChart) OR A paper copy in the mail If you have any lab test that is abnormal or we need to change your treatment, we will call you to review the results.  Testing/Procedures: NONE ordered at this time of appointment   Follow-Up: At [facility], you and your health needs are our priority.  As part of our continuing mission to provide you with exceptional heart care, we have created designated Provider Care Teams.  These Care Teams include your primary Cardiologist (physician) and Advanced Practice Providers (APPs -  Physician Assistants and Nurse Practitioners) who all work together to provide you with the care you need, when you need it.  We recommend signing up for the patient portal called "MyChart".  Sign up information is provided on this After Visit Summary.  MyChart is used to connect with patients for Virtual Visits (Telemedicine).  Patients are able to view lab/test results, encounter notes, upcoming appointments, etc.  Non-urgent messages can be sent to your provider as well.   To learn more about what you can do with MyChart, go to [email or web address].    Your next appointment:   1 year(s)  The format for your next appointment:   In Person  Provider:   Pixie Casino, MD     Other Instructions Monitor chest discomfort, if becomes more frequent, intense associated with exercise please give our office a call.   Important Information About [place]

## 2022-07-04 DIAGNOSIS — H25013 Cortical age-related cataract, bilateral: Secondary | ICD-10-CM | POA: Diagnosis not present

## 2022-07-04 DIAGNOSIS — H2513 Age-related nuclear cataract, bilateral: Secondary | ICD-10-CM | POA: Diagnosis not present

## 2022-07-04 DIAGNOSIS — H18413 Arcus senilis, bilateral: Secondary | ICD-10-CM | POA: Diagnosis not present

## 2022-07-04 DIAGNOSIS — H2512 Age-related nuclear cataract, left eye: Secondary | ICD-10-CM | POA: Diagnosis not present

## 2022-07-04 DIAGNOSIS — H25043 Posterior subcapsular polar age-related cataract, bilateral: Secondary | ICD-10-CM | POA: Diagnosis not present

## 2022-08-01 DIAGNOSIS — H25043 Posterior subcapsular polar age-related cataract, bilateral: Secondary | ICD-10-CM | POA: Diagnosis not present

## 2022-08-01 DIAGNOSIS — H31092 Other chorioretinal scars, left eye: Secondary | ICD-10-CM | POA: Diagnosis not present

## 2022-08-01 DIAGNOSIS — H33322 Round hole, left eye: Secondary | ICD-10-CM | POA: Diagnosis not present

## 2022-08-01 DIAGNOSIS — H43813 Vitreous degeneration, bilateral: Secondary | ICD-10-CM | POA: Diagnosis not present

## 2022-08-01 DIAGNOSIS — Q141 Congenital malformation of retina: Secondary | ICD-10-CM | POA: Diagnosis not present

## 2022-08-01 DIAGNOSIS — H35372 Puckering of macula, left eye: Secondary | ICD-10-CM | POA: Diagnosis not present

## 2022-08-01 DIAGNOSIS — H25813 Combined forms of age-related cataract, bilateral: Secondary | ICD-10-CM | POA: Diagnosis not present

## 2022-08-24 ENCOUNTER — Ambulatory Visit: Payer: Medicare HMO | Admitting: Dermatology

## 2022-08-24 VITALS — BP 156/85 | HR 78

## 2022-08-24 DIAGNOSIS — L821 Other seborrheic keratosis: Secondary | ICD-10-CM

## 2022-08-24 DIAGNOSIS — L578 Other skin changes due to chronic exposure to nonionizing radiation: Secondary | ICD-10-CM | POA: Diagnosis not present

## 2022-08-24 DIAGNOSIS — B078 Other viral warts: Secondary | ICD-10-CM | POA: Diagnosis not present

## 2022-08-24 DIAGNOSIS — Z1283 Encounter for screening for malignant neoplasm of skin: Secondary | ICD-10-CM | POA: Diagnosis not present

## 2022-08-24 DIAGNOSIS — D1801 Hemangioma of skin and subcutaneous tissue: Secondary | ICD-10-CM

## 2022-08-24 DIAGNOSIS — L820 Inflamed seborrheic keratosis: Secondary | ICD-10-CM | POA: Diagnosis not present

## 2022-08-24 DIAGNOSIS — L814 Other melanin hyperpigmentation: Secondary | ICD-10-CM | POA: Diagnosis not present

## 2022-08-24 DIAGNOSIS — D229 Melanocytic nevi, unspecified: Secondary | ICD-10-CM | POA: Diagnosis not present

## 2022-08-24 DIAGNOSIS — Z79899 Other long term (current) drug therapy: Secondary | ICD-10-CM | POA: Diagnosis not present

## 2022-08-24 MED ORDER — FLUOROURACIL 5 % EX CREA: TOPICAL_CREAM | Freq: Two times a day (BID) | CUTANEOUS | 3 refills | Status: AC

## 2022-08-24 NOTE — Progress Notes (Signed)
Follow-Up Visit   Subjective  Wendy Wheeler is a 72 y.o. female who presents for the following: Skin Problem. The patient presents for Total-Body Skin Exam (TBSE) for skin cancer screening and mole check.  The patient has spots, moles and lesions to be evaluated, some may be new or changing and the patient has concerns that these could be cancer.   The following portions of the chart were reviewed this encounter and updated as appropriate:   Tobacco  Allergies  Meds  Problems  Med Hx  Surg Hx  Fam Hx     Review of Systems:  No other skin or systemic complaints except as noted in HPI or Assessment and Plan.  Objective  Well appearing patient in no apparent distress; mood and affect are within normal limits.  A full examination was performed including scalp, head, eyes, ears, nose, lips, neck, chest, axillae, abdomen, back, buttocks, bilateral upper extremities, bilateral lower extremities, hands, feet, fingers, toes, fingernails, and toenails. All findings within normal limits unless otherwise noted below.  left mid back, right cheek (2) Stuck-on, waxy, tan-brown papules -- Discussed benign etiology and prognosis.      right plantar foot Verrucous papules -- Discussed viral etiology and contagion.    Assessment & Plan  Inflamed seborrheic keratosis (2) left mid back, right cheek See photo Symptomatic, irritating, patient would like treated.  Destruction of lesion - left mid back, right cheek Complexity: simple   Destruction method: cryotherapy   Informed consent: discussed and consent obtained   Timeout:  patient name, date of birth, surgical site, and procedure verified Lesion destroyed using liquid nitrogen: Yes   Region frozen until ice ball extended beyond lesion: Yes   Outcome: patient tolerated procedure well with no complications   Post-procedure details: wound care instructions given    Other viral warts right plantar foot Viral Wart (HPV) Counseling   Discussed viral / HPV (Human Papilloma Virus) etiology and risk of spread /infectivity to other areas of body as well as to other people.  Multiple treatments and methods may be required to clear warts and it is possible treatment may not be successful.  Treatment risks include discoloration; scarring and there is still potential for wart recurrence.   Patient decline LN2 today, she will be going on vacation in 2 days   Start [facility] wart paste  SM2 Fluorouracil 5% / Salicylic Acid [id number] Paste   Related Medications fluorouracil (EFUDEX) 5 % cream Apply topically 2 (two) times daily. Apply to warts at bedtime cover with tape remove in the morning  Lentigines - Scattered tan macules - Due to sun exposure - Benign-appearing, observe - Recommend daily broad spectrum sunscreen SPF 30+ to sun-exposed areas, reapply every 2 hours as needed. - Call for any changes  Seborrheic Keratoses - Stuck-on, waxy, tan-brown papules and/or plaques  - Benign-appearing - Discussed benign etiology and prognosis. - Observe - Call for any changes  Melanocytic Nevi - Tan-brown and/or pink-flesh-colored symmetric macules and papules - Benign appearing on exam today - Observation - Call clinic for new or changing moles - Recommend daily use of broad spectrum spf 30+ sunscreen to sun-exposed areas.   Hemangiomas - Red papules - Discussed benign nature - Observe - Call for any changes  Actinic Damage - Chronic condition, secondary to cumulative UV/sun exposure - diffuse scaly erythematous macules with underlying dyspigmentation - Recommend daily broad spectrum sunscreen SPF 30+ to sun-exposed areas, reapply every 2 hours as needed.  - Staying in  the shade or wearing long sleeves, sun glasses (UVA+UVB protection) and wide brim hats (4-inch brim around the entire circumference of the hat) are also recommended for sun protection.  - Call for new or changing lesions.  Skin cancer screening  performed today.   Return if symptoms worsen or fail to improve.  IMarye Round, CMA, am acting as scribe for Sarina Ser, MD .  Documentation: I have reviewed the above documentation for accuracy and completeness, and I agree with the above.  Sarina Ser, MD

## 2022-08-24 NOTE — Patient Instructions (Addendum)
Instructions for [facility] Medications- wart past  One or more of your medications was sent to the [facility] mail order compounding pharmacy. You will receive an email from them and can purchase the medicine through that link. It will then be mailed to your home at the address you confirmed. If for any reason you do not receive an email from them, please check your spam folder. If you still do not find the email, please let us know. [facility] phone number is [phone].         Cryotherapy Aftercare  Wash gently with soap and water everyday.   Apply Vaseline and Band-Aid daily until healed.      Due to recent changes in healthcare laws, you may see results of your pathology and/or laboratory studies on MyChart before the doctors have had a chance to review them. We understand that in some cases there may be results that are confusing or concerning to you. Please understand that not all results are received at the same time and often the doctors may need to interpret multiple results in order to provide you with the best plan of care or course of treatment. Therefore, we ask that you please give [place] 2 business days to thoroughly review all your results before contacting the office for clarification. Should we see a critical lab result, you will be contacted sooner.   If You Need Anything After Your Visit  If you have any questions or concerns for your doctor, please call our main line at [phone] and press option 4 to reach your doctor's medical assistant. If no one answers, please leave a voicemail as directed and we will return your call as soon as possible. Messages left after 4 pm will be answered the following business day.   You may also send [place] a message via [place]. We typically respond to MyChart messages within 1-2 business days.  For prescription refills, please ask your pharmacy to contact our office. Our fax number is [phone].  If you have an  urgent issue when the clinic is closed that cannot wait until the next business day, you can page your doctor at the number below.    Please note that while we do our best to be available for urgent issues outside of office hours, we are not available 24/7.   If you have an urgent issue and are unable to reach [place], you may choose to seek medical care at your doctor's office, retail clinic, urgent care center, or emergency room.  If you have a medical emergency, please immediately call 911 or go to the emergency department.  Pager Numbers  - Dr. Nehemiah Massed: [phone]  - Dr. Laurence Ferrari: [phone]  - Dr. Nicole Kindred: [phone]  In the event of inclement weather, please call our main line at [phone] for an update on the status of any delays or closures.  Dermatology Medication Tips: Please keep the boxes that topical medications come in in order to help keep track of the instructions about where and how to use these. Pharmacies typically print the medication instructions only on the boxes and not directly on the medication tubes.   If your medication is too expensive, please contact our office at [phone] option 4 or send [place] a message through [place].   We are unable to tell what your co-pay for medications will be in advance as this is different depending on your insurance coverage. However, we may be able to find a substitute medication at lower cost  or fill out paperwork to get insurance to cover a needed medication.   If a prior authorization is required to get your medication covered by your insurance company, please allow [place] 1-2 business days to complete this process.  Drug prices often vary depending on where the prescription is filled and some pharmacies may offer cheaper prices.  The website www.goodrx.com contains coupons for medications through different pharmacies. The prices here do not account for what the cost may be with help from insurance (it may be cheaper with your  insurance), but the website can give you the price if you did not use any insurance.  - You can print the associated coupon and take it with your prescription to the pharmacy.  - You may also stop by our office during regular business hours and pick up a GoodRx coupon card.  - If you need your prescription sent electronically to a different pharmacy, notify our office through [facility] or by phone at [phone] option 4.     Si Usted Necesita Algo Despus de Su Visita  Tambin puede enviarnos un mensaje a travs de Pharmacist, community. Por lo general respondemos a los mensajes de MyChart en el transcurso de 1 a 2 das hbiles.  Para renovar recetas, por favor pida a su farmacia que se ponga en contacto con nuestra oficina. Harland Dingwall de fax es [place] [phone].  Si tiene un asunto urgente cuando la clnica est cerrada y que no puede esperar hasta el siguiente da hbil, puede llamar/localizar a su doctor(a) al nmero que aparece a continuacin.   Por favor, tenga en cuenta que aunque hacemos todo lo posible para estar disponibles para asuntos urgentes fuera del horario de [place], no estamos disponibles las 24 horas del da, los 7 das de la [place].   Si tiene un problema urgente y no puede comunicarse con nosotros, puede optar por buscar atencin mdica  en el consultorio de su doctor(a), en una clnica privada, en un centro de atencin urgente o en una sala de emergencias.  Si tiene Engineering geologist, por favor llame inmediatamente al 911 o vaya a la sala de emergencias.  Nmeros de bper  - Dr. Nehemiah Massed: [phone]  - Dra. Moye: [phone]  - Dra. Nicole Kindred: [phone]  En caso de inclemencias del [place], por favor llame a Johnsie Kindred principal al [phone] para una actualizacin sobre el [place] de cualquier retraso o cierre.  Consejos para la medicacin en dermatologa: Por favor, guarde las cajas en las que vienen los medicamentos de uso tpico para ayudarle a  seguir las instrucciones sobre dnde y cmo usarlos. Las farmacias generalmente imprimen las instrucciones del medicamento slo en las cajas y no directamente en los tubos del [place].   Si su medicamento es muy caro, por favor, pngase en contacto con Zigmund Daniel llamando al [phone] y presione la opcin 4 o envenos un mensaje a travs de Pharmacist, community.   No podemos decirle cul ser su copago por los medicamentos por adelantado ya que esto es diferente dependiendo de la cobertura de su seguro. Sin embargo, es posible que podamos encontrar un medicamento sustituto a Electrical engineer un formulario para que el seguro cubra el medicamento que se considera necesario.   Si se requiere una autorizacin previa para que su compaa de seguros [place] su medicamento, por favor permtanos de 1 a 2 das hbiles para completar este proceso.  Los precios de los medicamentos varan con frecuencia dependiendo del Environmental consultant de dnde se surte la  receta y alguna farmacias pueden ofrecer precios ms baratos.  El sitio web www.goodrx.com tiene cupones para medicamentos de Airline pilot. Los precios aqu no tienen en cuenta lo que podra costar con la ayuda del seguro (puede ser ms barato con su seguro), pero el sitio web puede darle el precio si no utiliz Research scientist (physical sciences).  - Puede imprimir el cupn correspondiente y llevarlo con su receta a la farmacia.  - Tambin puede pasar por nuestra oficina durante el horario de atencin regular y Charity fundraiser una tarjeta de cupones de GoodRx.  - Si necesita que su receta se enve electrnicamente a una farmacia diferente, informe a nuestra oficina a travs de MyChart de [place] o por telfono llamando al [phone] y presione la opcin 4.

## 2022-08-27 ENCOUNTER — Encounter: Payer: Self-pay | Admitting: Dermatology

## 2022-09-18 DIAGNOSIS — H2512 Age-related nuclear cataract, left eye: Secondary | ICD-10-CM | POA: Diagnosis not present

## 2022-09-18 DIAGNOSIS — H52202 Unspecified astigmatism, left eye: Secondary | ICD-10-CM | POA: Diagnosis not present

## 2022-09-19 DIAGNOSIS — H2511 Age-related nuclear cataract, right eye: Secondary | ICD-10-CM | POA: Diagnosis not present

## 2022-10-02 ENCOUNTER — Encounter: Payer: Self-pay | Admitting: *Deleted

## 2022-10-02 DIAGNOSIS — H5231 Anisometropia: Secondary | ICD-10-CM | POA: Diagnosis not present

## 2022-10-02 DIAGNOSIS — H25011 Cortical age-related cataract, right eye: Secondary | ICD-10-CM | POA: Diagnosis not present

## 2022-10-02 DIAGNOSIS — H52223 Regular astigmatism, bilateral: Secondary | ICD-10-CM | POA: Diagnosis not present

## 2022-10-02 DIAGNOSIS — H2511 Age-related nuclear cataract, right eye: Secondary | ICD-10-CM | POA: Diagnosis not present

## 2022-10-02 DIAGNOSIS — Z961 Presence of intraocular lens: Secondary | ICD-10-CM | POA: Diagnosis not present

## 2022-10-02 DIAGNOSIS — H5203 Hypermetropia, bilateral: Secondary | ICD-10-CM | POA: Diagnosis not present

## 2022-10-02 DIAGNOSIS — H52201 Unspecified astigmatism, right eye: Secondary | ICD-10-CM | POA: Diagnosis not present

## 2022-10-02 DIAGNOSIS — H33322 Round hole, left eye: Secondary | ICD-10-CM | POA: Diagnosis not present

## 2022-10-05 ENCOUNTER — Telehealth: Payer: Self-pay

## 2022-10-05 NOTE — Telephone Encounter (Signed)
Called patient to schedule Medicare Annual Wellness Visit (AWV). No voicemail available to leave a message.  Last date of AWV: 05/26/21  Please schedule an appointment at any time on Annual Wellness Schedule.

## 2022-10-09 DIAGNOSIS — Z1212 Encounter for screening for malignant neoplasm of rectum: Secondary | ICD-10-CM | POA: Diagnosis not present

## 2022-10-09 DIAGNOSIS — Z1211 Encounter for screening for malignant neoplasm of colon: Secondary | ICD-10-CM | POA: Diagnosis not present

## 2022-10-17 LAB — COLOGUARD: COLOGUARD: POSITIVE — AB

## 2022-10-17 LAB — EXTERNAL GENERIC LAB PROCEDURE: COLOGUARD: POSITIVE — AB

## 2022-11-10 DIAGNOSIS — Z0100 Encounter for examination of eyes and vision without abnormal findings: Secondary | ICD-10-CM | POA: Diagnosis not present

## 2022-11-20 DIAGNOSIS — R7303 Prediabetes: Secondary | ICD-10-CM | POA: Diagnosis not present

## 2022-11-22 DIAGNOSIS — R195 Other fecal abnormalities: Secondary | ICD-10-CM | POA: Diagnosis not present

## 2022-11-22 DIAGNOSIS — K219 Gastro-esophageal reflux disease without esophagitis: Secondary | ICD-10-CM | POA: Diagnosis not present

## 2022-11-28 DIAGNOSIS — E559 Vitamin D deficiency, unspecified: Secondary | ICD-10-CM | POA: Diagnosis not present

## 2022-11-28 DIAGNOSIS — R7302 Impaired glucose tolerance (oral): Secondary | ICD-10-CM | POA: Diagnosis not present

## 2022-11-28 DIAGNOSIS — E7800 Pure hypercholesterolemia, unspecified: Secondary | ICD-10-CM | POA: Diagnosis not present

## 2022-11-28 DIAGNOSIS — I10 Essential (primary) hypertension: Secondary | ICD-10-CM | POA: Diagnosis not present

## 2022-12-14 DIAGNOSIS — K589 Irritable bowel syndrome without diarrhea: Secondary | ICD-10-CM | POA: Diagnosis not present

## 2022-12-14 DIAGNOSIS — K635 Polyp of colon: Secondary | ICD-10-CM | POA: Diagnosis not present

## 2022-12-14 DIAGNOSIS — K621 Rectal polyp: Secondary | ICD-10-CM | POA: Diagnosis not present

## 2022-12-14 DIAGNOSIS — D122 Benign neoplasm of ascending colon: Secondary | ICD-10-CM | POA: Diagnosis not present

## 2022-12-14 DIAGNOSIS — R195 Other fecal abnormalities: Secondary | ICD-10-CM | POA: Diagnosis not present

## 2022-12-28 ENCOUNTER — Other Ambulatory Visit: Payer: Self-pay | Admitting: Registered Nurse

## 2022-12-28 DIAGNOSIS — Z1231 Encounter for screening mammogram for malignant neoplasm of breast: Secondary | ICD-10-CM

## 2023-01-02 ENCOUNTER — Ambulatory Visit
Admission: RE | Admit: 2023-01-02 | Discharge: 2023-01-02 | Disposition: A | Discharge status: Home / Self Care | Payer: Medicare HMO | Source: Ambulatory Visit | Attending: Registered Nurse | Admitting: Registered Nurse

## 2023-01-02 DIAGNOSIS — Z1231 Encounter for screening mammogram for malignant neoplasm of breast: Secondary | ICD-10-CM | POA: Diagnosis not present

## 2023-02-06 DIAGNOSIS — H26493 Other secondary cataract, bilateral: Secondary | ICD-10-CM | POA: Diagnosis not present

## 2023-02-06 DIAGNOSIS — H43813 Vitreous degeneration, bilateral: Secondary | ICD-10-CM | POA: Diagnosis not present

## 2023-02-06 DIAGNOSIS — Z961 Presence of intraocular lens: Secondary | ICD-10-CM | POA: Diagnosis not present

## 2023-02-06 DIAGNOSIS — H35372 Puckering of macula, left eye: Secondary | ICD-10-CM | POA: Diagnosis not present

## 2023-02-06 DIAGNOSIS — H35411 Lattice degeneration of retina, right eye: Secondary | ICD-10-CM | POA: Diagnosis not present

## 2023-02-06 DIAGNOSIS — H43393 Other vitreous opacities, bilateral: Secondary | ICD-10-CM | POA: Diagnosis not present

## 2023-02-28 ENCOUNTER — Encounter: Payer: Self-pay | Admitting: Dermatology

## 2023-02-28 ENCOUNTER — Ambulatory Visit: Payer: Medicare HMO | Admitting: Dermatology

## 2023-02-28 DIAGNOSIS — B078 Other viral warts: Secondary | ICD-10-CM | POA: Diagnosis not present

## 2023-02-28 DIAGNOSIS — W908XXA Exposure to other nonionizing radiation, initial encounter: Secondary | ICD-10-CM | POA: Diagnosis not present

## 2023-02-28 DIAGNOSIS — Z7189 Other specified counseling: Secondary | ICD-10-CM

## 2023-02-28 DIAGNOSIS — L821 Other seborrheic keratosis: Secondary | ICD-10-CM | POA: Diagnosis not present

## 2023-02-28 DIAGNOSIS — L578 Other skin changes due to chronic exposure to nonionizing radiation: Secondary | ICD-10-CM

## 2023-02-28 DIAGNOSIS — L820 Inflamed seborrheic keratosis: Secondary | ICD-10-CM

## 2023-02-28 DIAGNOSIS — Z79899 Other long term (current) drug therapy: Secondary | ICD-10-CM

## 2023-02-28 NOTE — Patient Instructions (Addendum)
Cryotherapy Aftercare  Wash gently with soap and water everyday.   Apply Vaseline and Band-Aid daily until healed.   Instructions for [facility] Medications  One or more of your medications was sent to the [facility] mail order compounding pharmacy. You will receive an email from them and can purchase the medicine through that link. It will then be mailed to your home at the address you confirmed. If for any reason you do not receive an email from them, please check your spam folder. If you still do not find the email, please let us know. [facility] phone number is [phone].  Start prescription 5-fluorouracil/salicylic acid wart paste from [facility] nightly under occlusion. Reviewed risk of irritation and risk scarring if applied to normal skin. If irritation develops, stop medication for a few days until area calm, then restart a very small amount just to the wart. This medication cannot be used by pregnant women. Patient advised they will receive an email from the [facility] pharmacy and can purchase the medication online through a link in the email.   Due to recent changes in healthcare laws, you may see results of your pathology and/or laboratory studies on MyChart before the doctors have had a chance to review them. We understand that in some cases there may be results that are confusing or concerning to you. Please understand that not all results are received at the same time and often the doctors may need to interpret multiple results in order to provide you with the best plan of care or course of treatment. Therefore, we ask that you please give [place] 2 business days to thoroughly review all your results before contacting the office for clarification. Should we see a critical lab result, you will be contacted sooner.   If You Need Anything After Your Visit  If you have any questions or concerns for your doctor, please call our main line at [phone] and press  option 4 to reach your doctor's medical assistant. If no one answers, please leave a voicemail as directed and we will return your call as soon as possible. Messages left after 4 pm will be answered the following business day.   You may also send [place] a message via MyChart. We typically respond to MyChart messages within 1-2 business days.  For prescription refills, please ask your pharmacy to contact our office. Our fax number is [phone].  If you have an urgent issue when the clinic is closed that cannot wait until the next business day, you can page your doctor at the number below.    Please note that while we do our best to be available for urgent issues outside of office hours, we are not available 24/7.   If you have an urgent issue and are unable to reach [place], you may choose to seek medical care at your doctor's office, retail clinic, urgent care center, or emergency room.  If you have a medical emergency, please immediately call 911 or go to the emergency department.  Pager Numbers  - Dr. Gwen Pounds: [phone]  - Dr. Roseanne Reno: [phone]  - Dr. Katrinka Blazing: [phone]   In the event of inclement weather, please call our main line at [phone] for an update on the status of any delays or closures.  Dermatology Medication Tips: Please keep the boxes that topical medications come in in order to help keep track of the instructions about where and how to use these. Pharmacies typically print the medication instructions only on the  boxes and not directly on the medication tubes.   If your medication is too expensive, please contact our office at [phone] option 4 or send [place] a message through MyChart.   We are unable to tell what your co-pay for medications will be in advance as this is different depending on your insurance coverage. However, we may be able to find a substitute medication at lower cost or fill out paperwork to get insurance to cover a needed medication.   If a  prior authorization is required to get your medication covered by your insurance company, please allow [place] 1-2 business days to complete this process.  Drug prices often vary depending on where the prescription is filled and some pharmacies may offer cheaper prices.  The website www.goodrx.com contains coupons for medications through different pharmacies. The prices here do not account for what the cost may be with help from insurance (it may be cheaper with your insurance), but the website can give you the price if you did not use any insurance.  - You can print the associated coupon and take it with your prescription to the pharmacy.  - You may also stop by our office during regular business hours and pick up a GoodRx coupon card.  - If you need your prescription sent electronically to a different pharmacy, notify our office through [facility] or by phone at [phone] option 4.     Si Usted Necesita Algo Despus de Su Visita  Tambin puede enviarnos un mensaje a travs de Clinical cytogeneticist. Por lo general respondemos a los mensajes de MyChart en el transcurso de 1 a 2 das hbiles.  Para renovar recetas, por favor pida a su farmacia que se ponga en contacto con nuestra oficina. Annie Sable de fax es [place] [phone].  Si tiene un asunto urgente cuando la clnica est cerrada y que no puede esperar hasta el siguiente da hbil, puede llamar/localizar a su doctor(a) al nmero que aparece a continuacin.   Por favor, tenga en cuenta que aunque hacemos todo lo posible para estar disponibles para asuntos urgentes fuera del horario de [place], no estamos disponibles las 24 horas del da, los 7 [address]  [address] de la [place].   Si tiene un problema urgente y no puede comunicarse con nosotros, puede optar por buscar atencin mdica  en el consultorio de su doctor(a), en una clnica privada, en un centro de atencin urgente o en una sala de emergencias.  Si tiene Engineer, drilling, por favor llame  inmediatamente al 911 o vaya a la sala de emergencias.  Nmeros de bper  - Dr. Gwen Pounds: [phone]  - Dra. Roseanne Reno: [phone]  - Dr. Katrinka Blazing: [phone]   En caso de inclemencias del tiempo, por favor llame a Lacy Duverney principal al [phone] para una actualizacin sobre el [place] de cualquier retraso o cierre.  Consejos para la medicacin en dermatologa: Por favor, guarde las cajas en las que vienen los medicamentos de uso tpico para ayudarle a seguir las instrucciones sobre dnde y cmo usarlos. Las farmacias generalmente imprimen las instrucciones del medicamento slo en las cajas y no directamente en los tubos del [place].   Si su medicamento es muy caro, por favor, pngase en contacto con Rolm Gala llamando al [phone] y presione la opcin 4 o envenos un mensaje a travs de Clinical cytogeneticist.   No podemos decirle cul ser su copago por los medicamentos por adelantado ya que esto es diferente dependiendo de la cobertura de su seguro. Sin embargo, es posible que  podamos encontrar un medicamento sustituto a Audiological scientist un formulario para que el seguro cubra el medicamento que se considera necesario.   Si se requiere una autorizacin previa para que su compaa de seguros [place] su medicamento, por favor permtanos de 1 a 2 das hbiles para completar [address]  Los precios de los medicamentos varan con frecuencia dependiendo del Environmental consultant de dnde se surte la receta y alguna farmacias pueden ofrecer precios ms baratos.  El sitio web www.goodrx.com tiene cupones para medicamentos de Health and safety inspector. Los precios aqu no tienen en cuenta lo que podra costar con la ayuda del seguro (puede ser ms barato con su seguro), pero el sitio web puede darle el precio si no utiliz Tourist information centre manager.  - Puede imprimir el cupn correspondiente y llevarlo con su receta a la farmacia.  - Tambin puede pasar por nuestra oficina durante el horario de atencin regular y  Education officer, museum una tarjeta de cupones de GoodRx.  - Si necesita que su receta se enve electrnicamente a una farmacia diferente, informe a nuestra oficina a travs de MyChart de [place] o por telfono llamando al [phone] y presione la opcin 4.

## 2023-02-28 NOTE — Progress Notes (Signed)
Follow-Up Visit   Subjective  Wendy Wheeler is a 72 y.o. female who presents for the following: ISK and wart follow up. ISK treated with LN2 at left mid back and and right cheek. Wart at right plantar foot was not treated on office or at home, patient was going on vacation. Patient does have a spot at left face and right chest that are irritated.    The patient has spots, moles and lesions to be evaluated, some may be new or changing and the patient may have concern these could be cancer.   The following portions of the chart were reviewed this encounter and updated as appropriate: medications, allergies, medical history  Review of Systems:  No other skin or systemic complaints except as noted in HPI or Assessment and Plan.  Objective  Well appearing patient in no apparent distress; mood and affect are within normal limits.   A focused examination was performed of the following areas: Face, back, chest  Relevant exam findings are noted in the Assessment and Plan.  R cheek x 1, L mid back x 1, R medial infraclavicular x 1, L temple x 1 (4) Erythematous stuck-on, waxy papule or plaque, residual at right cheek and left mid back    Assessment & Plan     Inflamed seborrheic keratosis (4) R cheek x 1, L mid back x 1, R medial infraclavicular x 1, L temple x 1  Symptomatic, irritating, patient would like treated.  Benign-appearing.  Call clinic for new or changing lesions.    Destruction of lesion - R cheek x 1, L mid back x 1, R medial infraclavicular x 1, L temple x 1 (4)  Destruction method: cryotherapy   Informed consent: discussed and consent obtained   Lesion destroyed using liquid nitrogen: Yes   Cryotherapy cycles:  2 Outcome: patient tolerated procedure well with no complications   Post-procedure details: wound care instructions given    SEBORRHEIC KERATOSIS - Stuck-on, waxy, tan-brown papules and/or plaques  - Benign-appearing - Discussed benign etiology  and prognosis. - Observe - Call for any changes  ACTINIC DAMAGE - chronic, secondary to cumulative UV radiation exposure/sun exposure over time - diffuse scaly erythematous macules with underlying dyspigmentation - Recommend daily broad spectrum sunscreen SPF 30+ to sun-exposed areas, reapply every 2 hours as needed.  - Recommend staying in the shade or wearing long sleeves, sun glasses (UVA+UVB protection) and wide brim hats (4-inch brim around the entire circumference of the hat). - Call for new or changing lesions.  WART Exam: verrucous papule(s) at right plantar foot  Counseling Discussed viral / HPV (Human Papilloma Virus) etiology and risk of spread /infectivity to other areas of body as well as to other people.  Multiple treatments and methods may be required to clear warts and it is possible treatment may not be successful.  Treatment risks include discoloration; scarring and there is still potential for wart recurrence.  Treatment Plan: Start prescription 5-fluorouracil/salicylic acid wart paste from [facility] nightly under occlusion. Reviewed risk of irritation and risk scarring if applied to normal skin. If irritation develops, stop medication for a few days until area calm, then restart a very small amount just to the wart. This medication cannot be used by pregnant women. Patient advised they will receive an email from the [facility] pharmacy and can purchase the medication online through a link in the email.    Return in about 6 months (around 08/28/2023) for ISK follow up, with Dr.  K, Warts.  Anise Salvo, RMA, am acting as scribe for Armida Sans, MD .   Documentation: I have reviewed the above documentation for accuracy and completeness, and I agree with the above.  Armida Sans, MD

## 2023-03-03 ENCOUNTER — Encounter: Payer: Self-pay | Admitting: Dermatology

## 2023-03-19 IMAGING — MG MM DIGITAL SCREENING BILAT W/ TOMO AND CAD
8 series · 9 of 24 positions shown · non-contrast
Comparison: Previous exam(s).

CLINICAL DATA: Screening.

EXAM:
DIGITAL SCREENING BILATERAL MAMMOGRAM WITH TOMOSYNTHESIS AND CAD
TECHNIQUE: Bilateral screening digital craniocaudal and mediolateral oblique
mammograms were obtained. Bilateral screening digital breast
tomosynthesis was performed. The images were evaluated with
computer-aided detection.

[L CC synth-2D]
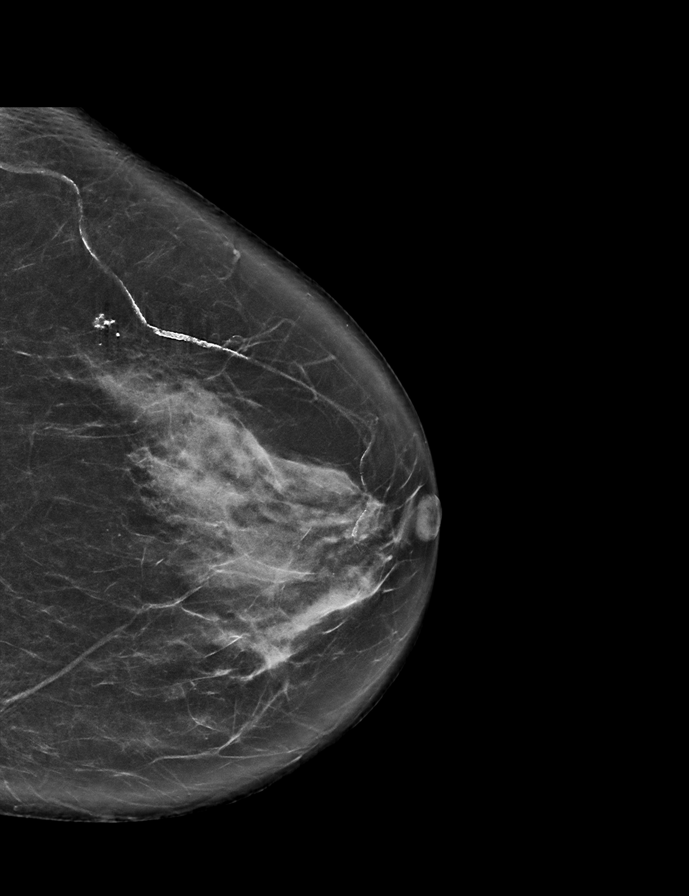

[R MLO synth-2D]
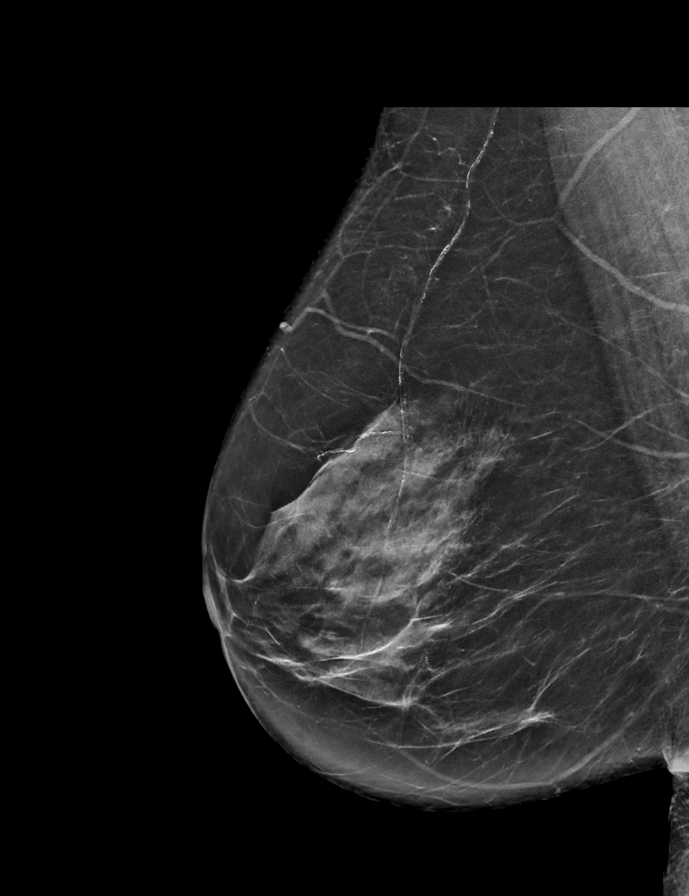

[R CC synth-2D]
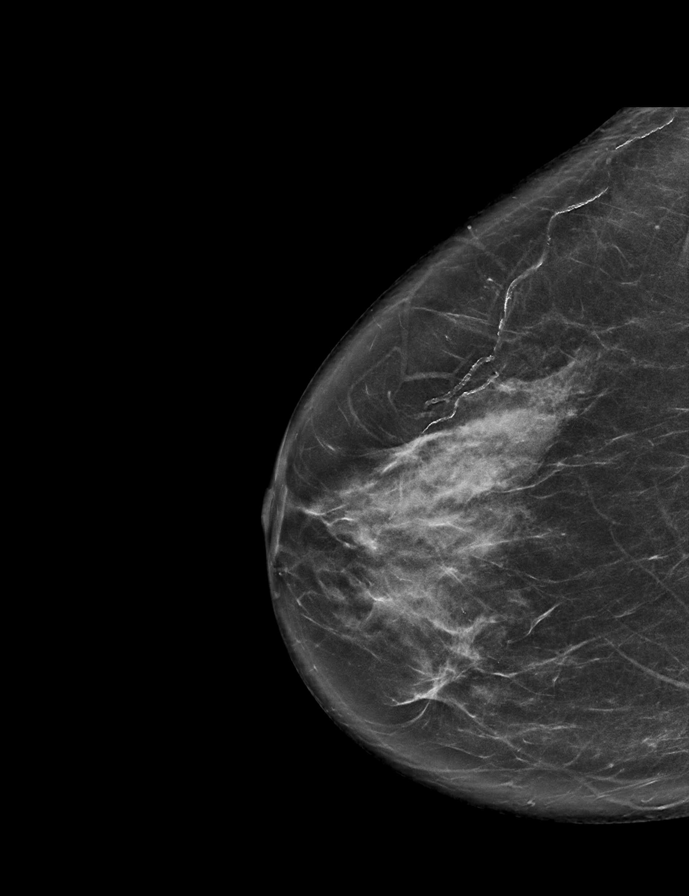

[L MLO synth-2D]
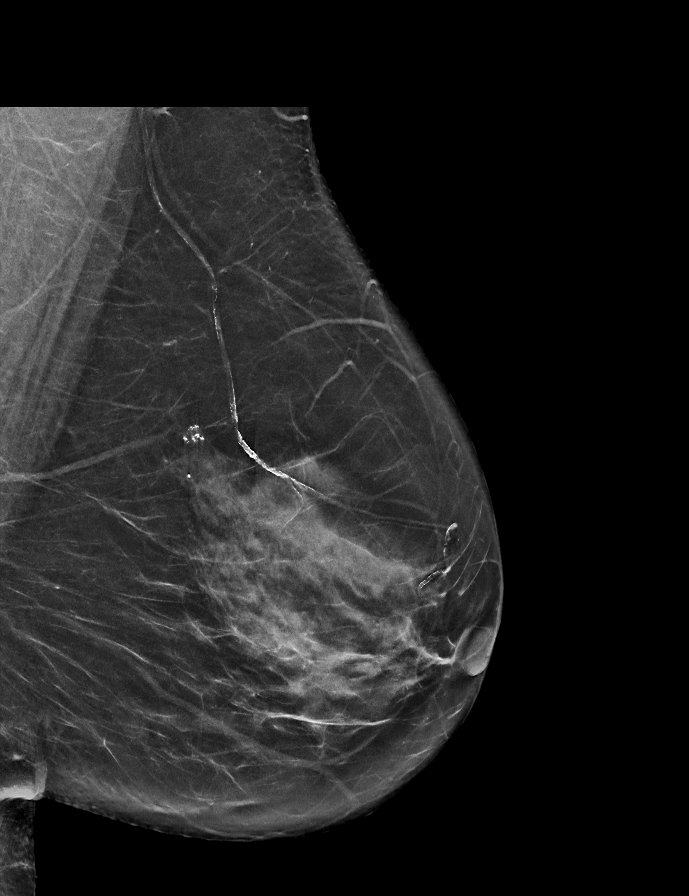

[R CC tomo · 2 of 69 frames shown]
[frame 23/69]
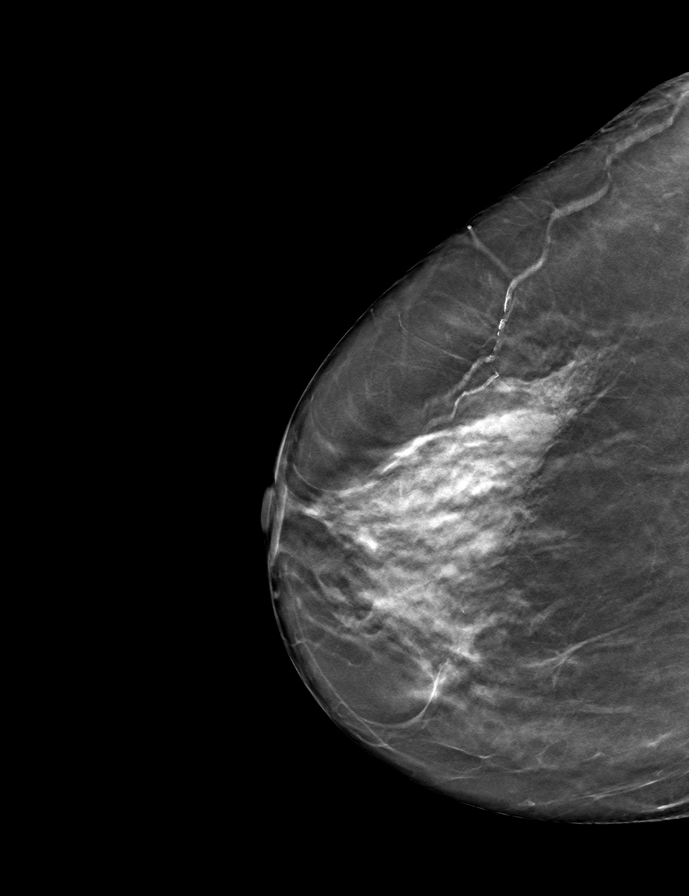
[frame 35/69]
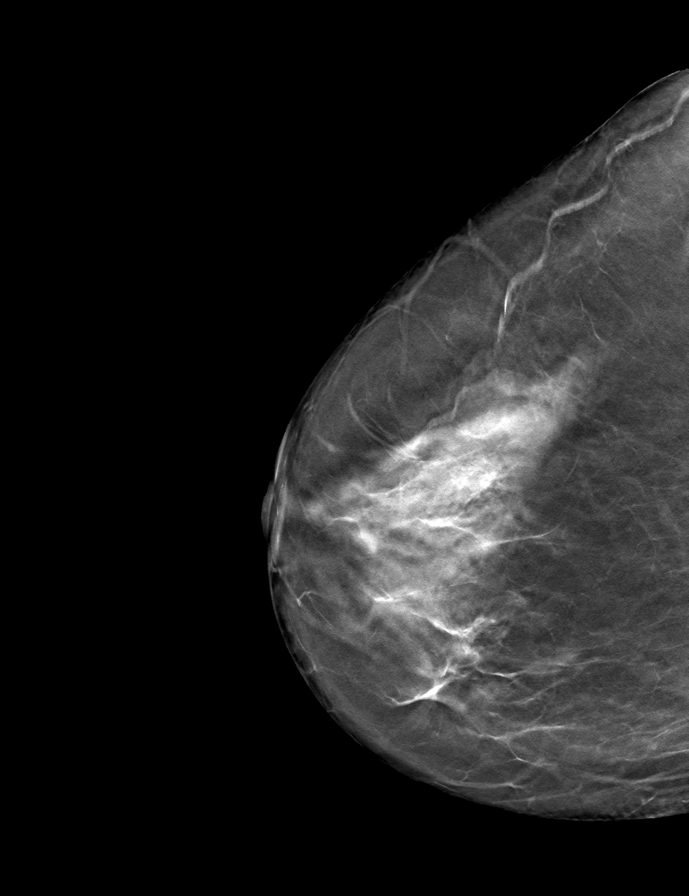

[L MLO tomo · tomo slice 35/69.0]
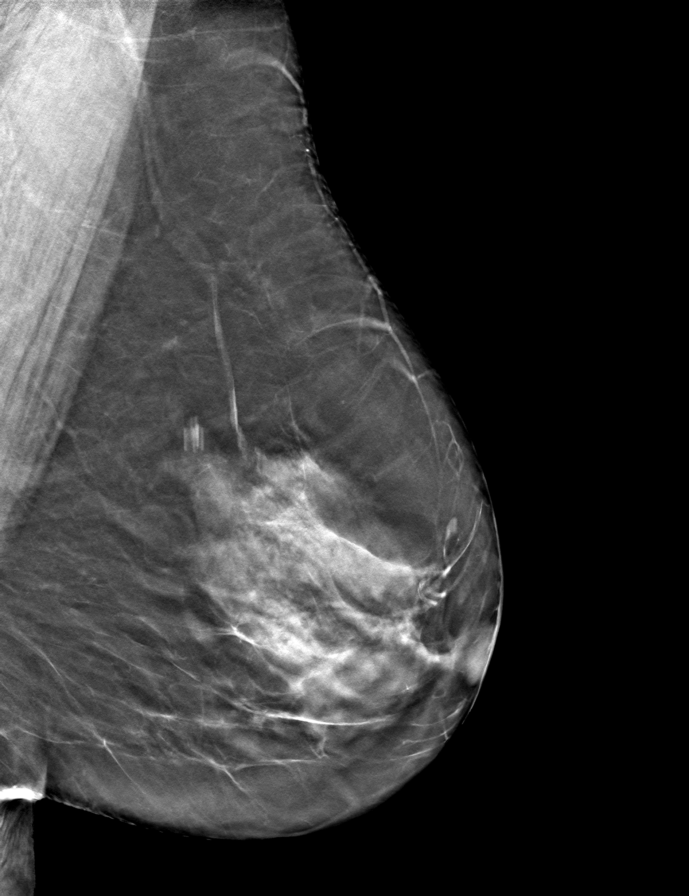

[L CC tomo · tomo slice 35/70.0]
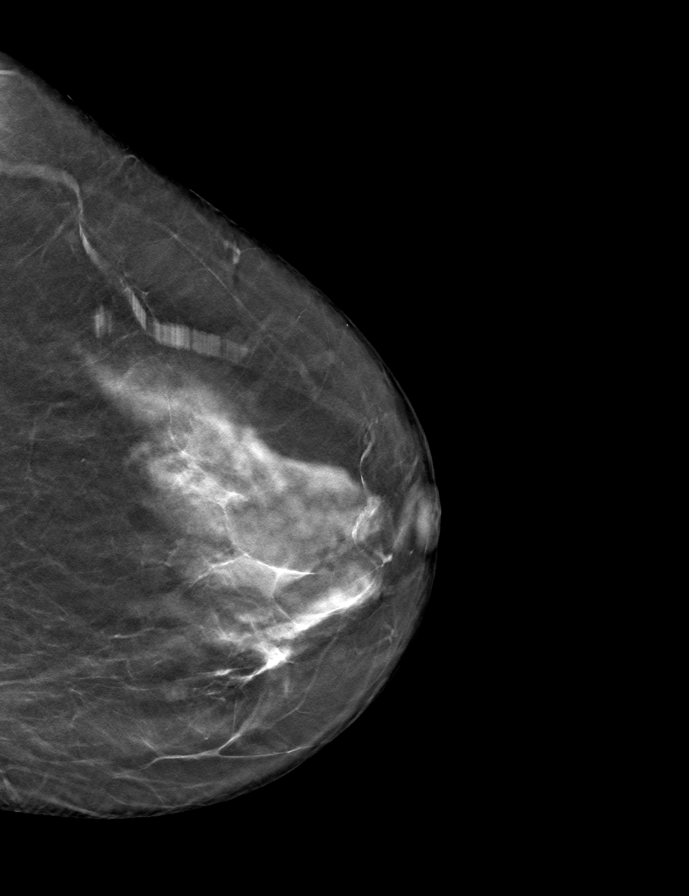

[R MLO tomo · tomo slice 34/67.0]
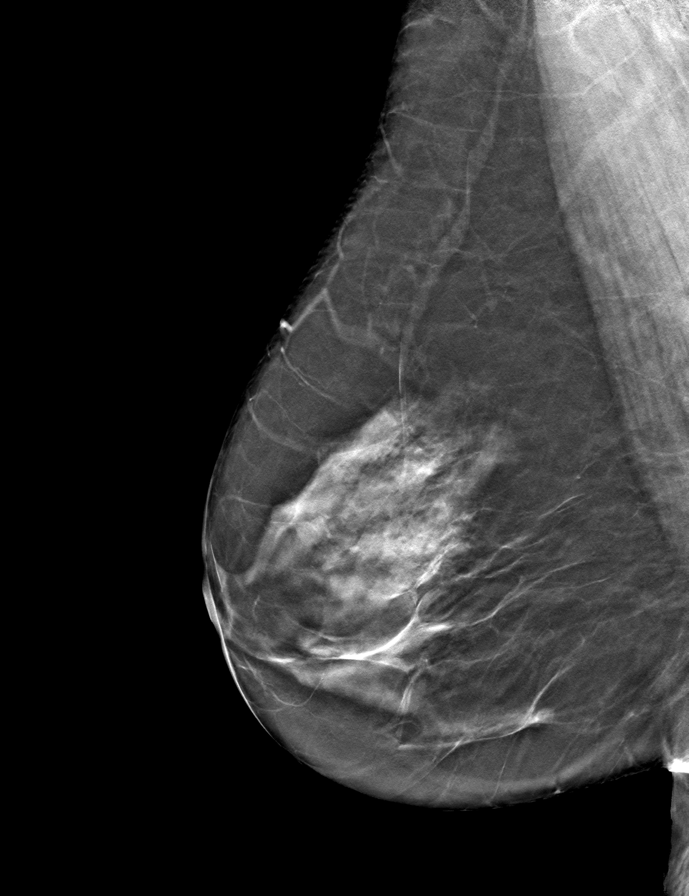

[9 of 24 positions shown; findings below may reference images not displayed]

ACR Breast Density Category c: The breast tissue is heterogeneously
dense, which may obscure small masses.
FINDINGS: There are no findings suspicious for malignancy. The images were
evaluated with computer-aided detection.
IMPRESSION: No mammographic evidence of malignancy. A result letter of this
screening mammogram will be mailed directly to the patient.

RECOMMENDATION:
Screening mammogram in one year. (Code:T4-5-GWO)

BI-RADS CATEGORY  1: Negative.

## 2023-05-23 DIAGNOSIS — E7800 Pure hypercholesterolemia, unspecified: Secondary | ICD-10-CM | POA: Diagnosis not present

## 2023-05-23 DIAGNOSIS — R7303 Prediabetes: Secondary | ICD-10-CM | POA: Diagnosis not present

## 2023-05-23 DIAGNOSIS — I10 Essential (primary) hypertension: Secondary | ICD-10-CM | POA: Diagnosis not present

## 2023-05-23 DIAGNOSIS — E559 Vitamin D deficiency, unspecified: Secondary | ICD-10-CM | POA: Diagnosis not present

## 2023-05-24 LAB — LAB REPORT - SCANNED
A1c: 6
EGFR: 81

## 2023-05-29 DIAGNOSIS — Z0000 Encounter for general adult medical examination without abnormal findings: Secondary | ICD-10-CM | POA: Diagnosis not present

## 2023-05-29 DIAGNOSIS — R1031 Right lower quadrant pain: Secondary | ICD-10-CM | POA: Diagnosis not present

## 2023-05-29 DIAGNOSIS — I10 Essential (primary) hypertension: Secondary | ICD-10-CM | POA: Diagnosis not present

## 2023-05-29 DIAGNOSIS — R7303 Prediabetes: Secondary | ICD-10-CM | POA: Diagnosis not present

## 2023-05-30 ENCOUNTER — Other Ambulatory Visit: Payer: Self-pay | Admitting: Registered Nurse

## 2023-05-30 DIAGNOSIS — R1031 Right lower quadrant pain: Secondary | ICD-10-CM

## 2023-06-04 ENCOUNTER — Ambulatory Visit
Admission: RE | Admit: 2023-06-04 | Discharge: 2023-06-04 | Disposition: A | Discharge status: Home / Self Care | Payer: Medicare HMO | Source: Ambulatory Visit | Attending: Registered Nurse | Admitting: Registered Nurse

## 2023-06-04 DIAGNOSIS — R1031 Right lower quadrant pain: Secondary | ICD-10-CM | POA: Diagnosis not present

## 2023-06-04 DIAGNOSIS — R1903 Right lower quadrant abdominal swelling, mass and lump: Secondary | ICD-10-CM | POA: Diagnosis not present

## 2023-06-06 DIAGNOSIS — R7303 Prediabetes: Secondary | ICD-10-CM | POA: Diagnosis not present

## 2023-06-06 DIAGNOSIS — R1031 Right lower quadrant pain: Secondary | ICD-10-CM | POA: Diagnosis not present

## 2023-06-06 DIAGNOSIS — E7800 Pure hypercholesterolemia, unspecified: Secondary | ICD-10-CM | POA: Diagnosis not present

## 2023-06-06 DIAGNOSIS — I10 Essential (primary) hypertension: Secondary | ICD-10-CM | POA: Diagnosis not present

## 2023-06-25 ENCOUNTER — Telehealth: Payer: Self-pay | Admitting: *Deleted

## 2023-06-25 ENCOUNTER — Ambulatory Visit: Payer: Medicare HMO | Admitting: Internal Medicine

## 2023-06-25 ENCOUNTER — Encounter: Payer: Self-pay | Admitting: Internal Medicine

## 2023-06-25 NOTE — Telephone Encounter (Signed)
 LMOVM (DPR) advising of appointment cancellation due to inclement weather. Main office number provided to reschedule.

## 2023-06-26 NOTE — Telephone Encounter (Signed)
 Patient rescheduled to 08/17/23 with Dr. Rennis Golden.

## 2023-08-15 DIAGNOSIS — H43393 Other vitreous opacities, bilateral: Secondary | ICD-10-CM | POA: Diagnosis not present

## 2023-08-15 DIAGNOSIS — H35411 Lattice degeneration of retina, right eye: Secondary | ICD-10-CM | POA: Diagnosis not present

## 2023-08-15 DIAGNOSIS — H26493 Other secondary cataract, bilateral: Secondary | ICD-10-CM | POA: Diagnosis not present

## 2023-08-15 DIAGNOSIS — H35373 Puckering of macula, bilateral: Secondary | ICD-10-CM | POA: Diagnosis not present

## 2023-08-17 ENCOUNTER — Encounter: Payer: Self-pay | Admitting: Internal Medicine

## 2023-08-17 ENCOUNTER — Ambulatory Visit: Payer: Medicare HMO | Attending: Internal Medicine | Admitting: Internal Medicine

## 2023-08-17 VITALS — BP 130/70 | HR 66 | Ht <= 58 in | Wt 114.0 lb

## 2023-08-17 DIAGNOSIS — I10 Essential (primary) hypertension: Secondary | ICD-10-CM

## 2023-08-17 DIAGNOSIS — I2510 Atherosclerotic heart disease of native coronary artery without angina pectoris: Secondary | ICD-10-CM

## 2023-08-17 DIAGNOSIS — E782 Mixed hyperlipidemia: Secondary | ICD-10-CM

## 2023-08-17 NOTE — Patient Instructions (Signed)
 Medication Instructions:  NO CHANGES  *If you need a refill on your cardiac medications before your next appointment, please call your pharmacy*   Follow-Up: At [facility], you and your health needs are our priority.  As part of our continuing mission to provide you with exceptional heart care, we have created designated Provider Care Teams.  These Care Teams include your primary Cardiologist (physician) and Advanced Practice Providers (APPs -  Physician Assistants and Nurse Practitioners) who all work together to provide you with the care you need, when you need it.  We recommend signing up for the patient portal called "MyChart".  Sign up information is provided on this After Visit Summary.  MyChart is used to connect with patients for Virtual Visits (Telemedicine).  Patients are able to view lab/test results, encounter notes, upcoming appointments, etc.  Non-urgent messages can be sent to your provider as well.   To learn more about what you can do with MyChart, go to [email or web address].    Your next appointment:   12 months with Azalee Course PA

## 2023-08-17 NOTE — Progress Notes (Signed)
 OFFICE CONSULT NOTE  Chief Complaint:  Routine follow-up  Primary Care Physician: Fatima Sanger, FNP  HPI:  Wendy Wheeler is a 73 y.o. female who is being seen today for the evaluation of coronary artery calcification at the request of No ref. provider found.  This is a pleasant 73 year old female with a history of long-standing tobacco abuse, who stopped smoking 2010.  She recently established her care with Dr. Sharee Holster.  She also has a history of hypertension and dyslipidemia.  She underwent a screening CT scan to look for any possible lung malignancy, and was found to have calcification of the LAD.  She reports being asymptomatic, denying any chest pain.  She does get short of breath if she goes up and down the stairs more than 10 times.  She is very physically active.  She has treated hypertension and recently was noted to have dyslipidemia.  Her LDL was 112.  She was placed on atorvastatin 20 mg daily and is tolerating that well.  Her LDL most recently was 81.  Her goal LDL is less than 70.  We discussed diet today.  She eats a somewhat atherogenic diet.  She eats a lot of red meat and is recently switched from fried foods more to baking.  She is tended to use vegetable oil but is moving towards all of oil and canola oil.  She is also worked on reducing other sources of saturated fats in her diet.  03/05/2018  Wendy Wheeler is seen today in follow-up.  Overall she continues to do well.  She denies chest pain or worsening shortness of breath.  After making significant dietary changes and please report her cholesterol profile is improved.  Total cholesterol now 178, HDL 42, triglycerides 89 and LDL of 81.  This is close to goal LDL less than 70.  She remains on low-dose atorvastatin 20 mg daily.  She denies any chest pain or worsening shortness of breath.  Blood pressure was elevated today 174/84 and may need to be followed closely.  She has not done a lot of physical activity, having  concerned about her abnormal calcium score.  I reassured her that she could do exertion and should monitor for any symptoms with exercise.  10/31/2019  Wendy Wheeler is seen today for follow-up.  Overall she is without new complaints.  Recent lipids were total cholesterol 185, triglycerides 60, HDL 95 and LDL 79.  She is on atorvastatin 40 and LDL now is much better controlled.  I encouraged her to continue work on diet and exercise.  She reports not being a smoker although did some in the past.  She is noted to be wheezy today.  She thinks it might be related to allergies which have been worse since she has been in [place].  02/02/2021  Wendy Wheeler is seen today in follow-up.  Overall she says she is feeling well.  She says she does about 50 stairs a day and has no chest pain or worsening shortness of breath.  She does have somewhat of a bronchitic cough at times.  I reminded her that her CT scan last in December 2020 did show some centrilobular emphysema and she has almost a 60-pack-year smoking history.  Fortunately she quit however she is not currently on any inhalers at this time.  She may need further pulmonary optimization.  She denies any chest pain.  Her cholesterol is higher than target.  Her last LDL was 80.  She said  she was not regularly taking atorvastatin but has been recently and has follow-up with her PCP soon who will repeat labs.  Blood pressure was elevated today however again she said this may be whitecoat hypertension.  She says her home blood pressure readings generally between 120s to 130s over 70s.  08/17/2023  Wendy Wheeler returns today for follow-up.  Overall she has been doing well.  She saw Azalee Course, PA-C last January.  At that time she was doing well without any chest pain or shortness of breath.  Blood pressure was a little elevated.  Today her blood pressure is normal and in fact is even a little better at home.  Weight is normal.  Heart rate is well-controlled.  She had lipids  in December which showed total cholesterol 158, HDL 89, triglycerides 43 and LDL 59.  She remains physically active.  She reports good energy level.  She says her sleep is improved.  She was a past smoker but has not restarted smoking in the past 20 years.  PMHx:  Past Medical History:  Diagnosis Date   Hyperlipidemia    Hypertension     No past surgical history on file.  FAMHx:  Family History  Problem Relation Age of Onset   Cancer Mother        breast   Hyperlipidemia Mother    Hypertension Mother    Hypertension Father    Hyperlipidemia Father    Depression Father        suicide   Cancer Sister        bladder   Heart disease Paternal Grandmother    Stroke Paternal Grandfather    Bladder Cancer Sister    Diabetes Brother    Cerebral palsy Brother     SOCHx:   reports that she quit smoking about 17 years ago. Her smoking use included cigarettes. She has never used smokeless tobacco. She reports that she does not drink alcohol and does not use drugs.  ALLERGIES:  Allergies  Allergen Reactions   Aleve [Naproxen Sodium] Swelling    ROS: Pertinent items noted in HPI and remainder of comprehensive ROS otherwise negative.  HOME MEDS: Current Outpatient Medications on File Prior to Visit  Medication Sig Dispense Refill   Ascorbic Acid (VITAMIN C) 1000 MG tablet Take 1,000 mg by mouth daily.     aspirin EC 81 MG tablet Take 81 mg by mouth daily.     atorvastatin (LIPITOR) 40 MG tablet TAKE 1 TABLET AT BEDTIME 90 tablet 10   cholecalciferol (VITAMIN D) 1000 units tablet Take 1,000 Units by mouth daily. Takes 3 times daily- 3,000 QD     cyclobenzaprine (FLEXERIL) 10 MG tablet Take 1 tablet (10 mg total) by mouth 3 (three) times daily as needed for muscle spasms. 30 tablet 0   fluorouracil (EFUDEX) 5 % cream Apply topically 2 (two) times daily. Apply to warts at bedtime cover with tape remove in the morning 15 g 3   hydrochlorothiazide (HYDRODIURIL) 25 MG tablet TAKE 1  TABLET EVERY DAY 90 tablet 0   losartan (COZAAR) 50 MG tablet Take 50 mg by mouth daily.     Menaquinone-7 (VITAMIN K2 PO) Take 1 tablet by mouth daily.     metoprolol succinate (TOPROL-XL) 25 MG 24 hr tablet TAKE 1 TABLET EVERY DAY 90 tablet 10   Multiple Vitamins-Minerals (HAIR SKIN AND NAILS FORMULA PO) Take 1 tablet by mouth daily.     vitamin B-12 (CYANOCOBALAMIN) 1000 MCG tablet Take 1,000 mcg  by mouth daily.     No current facility-administered medications on file prior to visit.    LABS/IMAGING: No results found for this or any previous visit (from the past 48 hours). No results found.  LIPID PANEL:    Component Value Date/Time   CHOL 155 01/26/2022 1027   TRIG 50 01/26/2022 1027   HDL 79 01/26/2022 1027   CHOLHDL 2.0 01/26/2022 1027   LDLCALC 65 01/26/2022 1027    WEIGHTS: Wt Readings from Last 3 Encounters:  08/17/23 114 lb (51.7 kg)  06/20/22 115 lb (52.2 kg)  01/25/22 114 lb (51.7 kg)    VITALS: BP 130/70   Pulse 66   Ht 4\' 10"  (1.473 m)   Wt 114 lb (51.7 kg)   SpO2 94%   BMI 23.83 kg/m   EXAM: General appearance: alert and no distress Neck: no carotid bruit, no JVD and thyroid not enlarged, symmetric, no tenderness/mass/nodules Lungs: wheezes bilaterally Heart: regular rate and rhythm Abdomen: soft, non-tender; bowel sounds normal; no masses,  no organomegaly and scaphoid Extremities: extremities normal, atraumatic, no cyanosis or edema Pulses: 2+ and symmetric Skin: Skin color, texture, turgor normal. No rashes or lesions Neurologic: Grossly normal Psych: Pleasant  EKG: EKG Interpretation Date/Time:  Friday August 17 2023 13:37:47 EST Ventricular Rate:  66 PR Interval:  158 QRS Duration:  94 QT Interval:  406 QTC Calculation: 425 R Axis:   -39  Text Interpretation: Normal sinus rhythm Left axis deviation Septal infarct , age undetermined No previous ECGs available No significant change since last tracing Confirmed by Zoila Shutter [phone])  on 08/17/2023 1:59:32 PM    ASSESSMENT: Single-vessel coronary artery calcification of the LAD History of tobacco abuse -wheezing, question chronic bronchitis  Hypertension Dyslipidemia, goal LDL <70  PLAN: 1.   Wendy Wheeler seems to be doing well without any chest pain or worsening shortness of breath.  She remains physically active and goes up stairs a lot in her home as well as doing housework.  Blood pressure is well-controlled.  Her cholesterol is at goal with a target LDL less than 70.  No recommended changes to her regimen at this time.  Plan follow-up with [place] annually or sooner as necessary.  Chrystie Nose, MD, [facility], FACP  [place]  [facility]  Medical Director of the Advanced Lipid Disorders &  Cardiovascular Risk Reduction Clinic Diplomate of the American Board of Clinical Lipidology Attending Cardiologist  Direct Dial: [phone]  Fax: [phone]  Website:  [email or web address] 08/17/2023, 1:59 PM

## 2023-08-29 ENCOUNTER — Ambulatory Visit (INDEPENDENT_AMBULATORY_CARE_PROVIDER_SITE_OTHER): Payer: Medicare HMO | Admitting: Dermatology

## 2023-08-29 ENCOUNTER — Encounter: Payer: Self-pay | Admitting: Dermatology

## 2023-08-29 DIAGNOSIS — L578 Other skin changes due to chronic exposure to nonionizing radiation: Secondary | ICD-10-CM | POA: Diagnosis not present

## 2023-08-29 DIAGNOSIS — L821 Other seborrheic keratosis: Secondary | ICD-10-CM

## 2023-08-29 DIAGNOSIS — W908XXA Exposure to other nonionizing radiation, initial encounter: Secondary | ICD-10-CM

## 2023-08-29 DIAGNOSIS — B079 Viral wart, unspecified: Secondary | ICD-10-CM | POA: Diagnosis not present

## 2023-08-29 DIAGNOSIS — L820 Inflamed seborrheic keratosis: Secondary | ICD-10-CM | POA: Diagnosis not present

## 2023-08-29 DIAGNOSIS — Z7189 Other specified counseling: Secondary | ICD-10-CM | POA: Diagnosis not present

## 2023-08-29 DIAGNOSIS — Z79899 Other long term (current) drug therapy: Secondary | ICD-10-CM

## 2023-08-29 NOTE — Patient Instructions (Signed)

## 2023-08-29 NOTE — Progress Notes (Signed)
 Follow-Up Visit   Subjective  Wendy Wheeler is a 73 y.o. female who presents for the following: ISK and wart follow up. Some of the previously treated ISK have improved, but are still persistent. She has other irritated skin lesions on the face she would like treated today. Wart on the R plantar foot has resolved.   The patient has spots, moles and lesions to be evaluated, some may be new or changing and the patient may have concern these could be cancer.  The following portions of the chart were reviewed this encounter and updated as appropriate: medications, allergies, medical history  Review of Systems:  No other skin or systemic complaints except as noted in HPI or Assessment and Plan.  Objective  Well appearing patient in no apparent distress; mood and affect are within normal limits.   A focused examination was performed of the following areas: Face, back, chest  Relevant exam findings are noted in the Assessment and Plan.  L temple x 1 (residual), R preauricular x 1, R infra clavicular x 1 (residual) (2) Erythematous stuck-on, waxy papule or plaque, residual at right cheek and left mid back R plantar foot x 1 0.4 cm Verrucous papule -- Discussed viral etiology and contagion.    Assessment & Plan  INFLAMED SEBORRHEIC KERATOSIS (2) L temple x 1 (residual), R preauricular x 1, R infra clavicular x 1 (residual) (2) Symptomatic, irritating, patient would like treated.   Destruction of lesion - L temple x 1 (residual), R preauricular x 1, R infra clavicular x 1 (residual) (2) Complexity: simple   Destruction method: cryotherapy   Informed consent: discussed and consent obtained   Timeout:  patient name, date of birth, surgical site, and procedure verified Lesion destroyed using liquid nitrogen: Yes   Region frozen until ice ball extended beyond lesion: Yes   Outcome: patient tolerated procedure well with no complications   Post-procedure details: wound care  instructions given   VIRAL WARTS, UNSPECIFIED TYPE R plantar foot x 1 Counseling Discussed viral / HPV (Human Papilloma Virus) etiology and risk of spread /infectivity to other areas of body as well as to other people.  Multiple treatments and methods may be required to clear warts and it is possible treatment may not be successful.  Treatment risks include discoloration; scarring and there is still potential for wart recurrence.  Treatment Plan: Start prescription 5-fluorouracil/salicylic acid wart paste from [facility] nightly under occlusion. Reviewed risk of irritation and risk scarring if applied to normal skin. If irritation develops, stop medication for a few days until area calm, then restart a very small amount just to the wart. This medication cannot be used by pregnant women. Patient advised they will receive an email from the [facility] pharmacy and can purchase the medication online through a link in the email. Destruction of lesion - R plantar foot x 1 Complexity: simple   Destruction method: cryotherapy   Informed consent: discussed and consent obtained   Timeout:  patient name, date of birth, surgical site, and procedure verified Lesion destroyed using liquid nitrogen: Yes   Region frozen until ice ball extended beyond lesion: Yes   Outcome: patient tolerated procedure well with no complications   Post-procedure details: wound care instructions given   ACTINIC SKIN DAMAGE   SEBORRHEIC KERATOSIS   COUNSELING AND COORDINATION OF CARE   MEDICATION MANAGEMENT    SEBORRHEIC KERATOSIS - Stuck-on, waxy, tan-brown papules and/or plaques  - Benign-appearing - Discussed benign etiology and prognosis. -  Observe - Call for any changes  ACTINIC DAMAGE - chronic, secondary to cumulative UV radiation exposure/sun exposure over time - diffuse scaly erythematous macules with underlying dyspigmentation - Recommend daily broad spectrum sunscreen SPF 30+ to sun-exposed  areas, reapply every 2 hours as needed.  - Recommend staying in the shade or wearing long sleeves, sun glasses (UVA+UVB protection) and wide brim hats (4-inch brim around the entire circumference of the hat). - Call for new or changing lesions.  Return in about 6 months (around 02/29/2024) for ISK and wart follow up.  Maylene Roes, CMA, am acting as scribe for Armida Sans, MD .  Documentation: I have reviewed the above documentation for accuracy and completeness, and I agree with the above.  Armida Sans, MD

## 2023-12-06 DIAGNOSIS — R7303 Prediabetes: Secondary | ICD-10-CM | POA: Diagnosis not present

## 2023-12-06 LAB — LAB REPORT - SCANNED
A1c: 6
EGFR: 72

## 2023-12-12 DIAGNOSIS — R7303 Prediabetes: Secondary | ICD-10-CM | POA: Diagnosis not present

## 2023-12-12 DIAGNOSIS — E559 Vitamin D deficiency, unspecified: Secondary | ICD-10-CM | POA: Diagnosis not present

## 2023-12-12 DIAGNOSIS — E7800 Pure hypercholesterolemia, unspecified: Secondary | ICD-10-CM | POA: Diagnosis not present

## 2023-12-12 DIAGNOSIS — I10 Essential (primary) hypertension: Secondary | ICD-10-CM | POA: Diagnosis not present

## 2023-12-12 DIAGNOSIS — I2510 Atherosclerotic heart disease of native coronary artery without angina pectoris: Secondary | ICD-10-CM | POA: Diagnosis not present

## 2023-12-28 DIAGNOSIS — I10 Essential (primary) hypertension: Secondary | ICD-10-CM | POA: Diagnosis not present

## 2024-02-13 DIAGNOSIS — H43393 Other vitreous opacities, bilateral: Secondary | ICD-10-CM | POA: Diagnosis not present

## 2024-02-13 DIAGNOSIS — H35373 Puckering of macula, bilateral: Secondary | ICD-10-CM | POA: Diagnosis not present

## 2024-02-13 DIAGNOSIS — H59812 Chorioretinal scars after surgery for detachment, left eye: Secondary | ICD-10-CM | POA: Diagnosis not present

## 2024-02-13 DIAGNOSIS — H35411 Lattice degeneration of retina, right eye: Secondary | ICD-10-CM | POA: Diagnosis not present

## 2024-02-26 ENCOUNTER — Ambulatory Visit: Admitting: Dermatology

## 2024-03-26 ENCOUNTER — Encounter: Payer: Self-pay | Admitting: Dermatology

## 2024-03-26 ENCOUNTER — Ambulatory Visit: Admitting: Dermatology

## 2024-03-26 DIAGNOSIS — L578 Other skin changes due to chronic exposure to nonionizing radiation: Secondary | ICD-10-CM | POA: Diagnosis not present

## 2024-03-26 DIAGNOSIS — C44319 Basal cell carcinoma of skin of other parts of face: Secondary | ICD-10-CM

## 2024-03-26 DIAGNOSIS — W908XXA Exposure to other nonionizing radiation, initial encounter: Secondary | ICD-10-CM

## 2024-03-26 DIAGNOSIS — D489 Neoplasm of uncertain behavior, unspecified: Secondary | ICD-10-CM | POA: Diagnosis not present

## 2024-03-26 DIAGNOSIS — L820 Inflamed seborrheic keratosis: Secondary | ICD-10-CM

## 2024-03-26 DIAGNOSIS — L821 Other seborrheic keratosis: Secondary | ICD-10-CM | POA: Diagnosis not present

## 2024-03-26 DIAGNOSIS — C4491 Basal cell carcinoma of skin, unspecified: Secondary | ICD-10-CM

## 2024-03-26 HISTORY — DX: Basal cell carcinoma of skin, unspecified: C44.91

## 2024-03-26 NOTE — Progress Notes (Signed)
 Follow-Up Visit   Subjective  Wendy Wheeler is a 73 y.o. female who presents for the following:  6 month isk and wart follow up  Spot at right forehead notice after last visit,  Also reports a spots left shoulder  The patient has spots, moles and lesions to be evaluated, some may be new or changing and the patient may have concern these could be cancer.  The following portions of the chart were reviewed this encounter and updated as appropriate: medications, allergies, medical history  Review of Systems:  No other skin or systemic complaints except as noted in HPI or Assessment and Plan.  Objective  Well appearing patient in no apparent distress; mood and affect are within normal limits.  A focused examination was performed of the following areas: Face, arms, right foot   Relevant exam findings are noted in the Assessment and Plan.  left shoulder x 1, face x 15, right clavicle x 1 (17) Erythematous stuck-on, waxy papule or plaque Right Forehead 1.2 cm crusted papule    Assessment & Plan   INFLAMED SEBORRHEIC KERATOSIS (17) left shoulder x 1, face x 15, right clavicle x 1 (17) Symptomatic, irritating, patient would like treated. Destruction of lesion - left shoulder x 1, face x 15, right clavicle x 1 (17) Complexity: simple   Destruction method: cryotherapy   Informed consent: discussed and consent obtained   Timeout:  patient name, date of birth, surgical site, and procedure verified Lesion destroyed using liquid nitrogen: Yes   Region frozen until ice ball extended beyond lesion: Yes   Outcome: patient tolerated procedure well with no complications   Post-procedure details: wound care instructions given    NEOPLASM OF UNCERTAIN BEHAVIOR Right Forehead Epidermal / dermal shaving  Lesion diameter (cm):  1.2 Informed consent: discussed and consent obtained   Timeout: patient name, date of birth, surgical site, and procedure verified   Procedure prep:  Patient  was prepped and draped in usual sterile fashion Prep type:  Isopropyl alcohol Anesthesia: the lesion was anesthetized in a standard fashion   Anesthetic:  1% lidocaine w/ epinephrine 1-100,000 buffered w/ 8.4% NaHCO3 Instrument used: flexible razor blade   Hemostasis achieved with: pressure, aluminum chloride and electrodesiccation   Outcome: patient tolerated procedure well   Post-procedure details: sterile dressing applied and wound care instructions given   Dressing type: bandage and petrolatum    Destruction of lesion Complexity: extensive   Destruction method: electrodesiccation and curettage   Informed consent: discussed and consent obtained   Timeout:  patient name, date of birth, surgical site, and procedure verified Procedure prep:  Patient was prepped and draped in usual sterile fashion Prep type:  Isopropyl alcohol Anesthesia: the lesion was anesthetized in a standard fashion   Anesthetic:  1% lidocaine w/ epinephrine 1-100,000 buffered w/ 8.4% NaHCO3 Curettage performed in three different directions: Yes   Electrodesiccation performed over the curetted area: Yes   Lesion length (cm):  1.2 Lesion width (cm):  1.2 Margin per side (cm):  0.2 Final wound size (cm):  1.6 Hemostasis achieved with:  pressure, aluminum chloride and electrodesiccation Outcome: patient tolerated procedure well with no complications   Post-procedure details: sterile dressing applied and wound care instructions given   Dressing type: bandage and petrolatum    Specimen 1 - Surgical pathology Differential Diagnosis: r/o bcc vs other ED&C today  Check Margins: No ACTINIC SKIN DAMAGE   SEBORRHEIC KERATOSIS    SEBORRHEIC KERATOSIS - Stuck-on, waxy, tan-brown papules and/or plaques  -  Benign-appearing - Discussed benign etiology and prognosis. - Observe - Call for any changes  ACTINIC DAMAGE - chronic, secondary to cumulative UV radiation exposure/sun exposure over time - diffuse scaly  erythematous macules with underlying dyspigmentation - Recommend daily broad spectrum sunscreen SPF 30+ to sun-exposed areas, reapply every 2 hours as needed.  - Recommend staying in the shade or wearing long sleeves, sun glasses (UVA+UVB protection) and wide brim hats (4-inch brim around the entire circumference of the hat). - Call for new or changing lesions.  Return in about 6 months (around 09/24/2024) for tbse .  IEleanor Blush, CMA, am acting as scribe for Alm Rhyme, MD.   Documentation: I have reviewed the above documentation for accuracy and completeness, and I agree with the above.  Alm Rhyme, MD

## 2024-03-26 NOTE — Patient Instructions (Addendum)
 Electrodesiccation and Curettage ("Scrape and Burn") Wound Care Instructions  Leave the original bandage on for 24 hours if possible.  If the bandage becomes soaked or soiled before that time, it is OK to remove it and examine the wound.  A small amount of post-operative bleeding is normal.  If excessive bleeding occurs, remove the bandage, place gauze over the site and apply continuous pressure (no peeking) over the area for 30 minutes. If this does not work, please call our clinic as soon as possible or page your doctor if it is after hours.   Once a day, cleanse the wound with soap and water. It is fine to shower. If a thick crust develops you may use a Q-tip dipped into dilute hydrogen peroxide (mix 1:1 with water) to dissolve it.  Hydrogen peroxide can slow the healing process, so use it only as needed.    After washing, apply petroleum jelly (Vaseline) or an antibiotic ointment if your doctor prescribed one for you, followed by a bandage.    For best healing, the wound should be covered with a layer of ointment at all times. If you are not able to keep the area covered with a bandage to hold the ointment in place, this may mean re-applying the ointment several times a day.  Continue this wound care until the wound has healed and is no longer open. It may take several weeks for the wound to heal and close.  Itching and mild discomfort is normal during the healing process.  If you have any discomfort, you can take Tylenol  (acetaminophen ) or ibuprofen as directed on the bottle. (Please do not take these if you have an allergy to them or cannot take them for another reason).  Some redness, tenderness and white or yellow material in the wound is normal healing.  If the area becomes very sore and red, or develops a thick yellow-green material (pus), it may be infected; please notify us .    Wound healing continues for up to one year following surgery. It is not unusual to experience pain in the scar  from time to time during the interval.  If the pain becomes severe or the scar thickens, you should notify the office.    A slight amount of redness in a scar is expected for the first six months.  After six months, the redness will fade and the scar will soften and fade.  The color difference becomes less noticeable with time.  If there are any problems, return for a post-op surgery check at your earliest convenience.  To improve the appearance of the scar, you can use silicone scar gel, cream, or sheets (such as Mederma or Serica) every night for up to one year. These are available over the counter (without a prescription).  Please call our office at [phone] for any questions or concerns.  Biopsy Wound Care Instructions  Leave the original bandage on for 24 hours if possible.  If the bandage becomes soaked or soiled before that time, it is OK to remove it and examine the wound.  A small amount of post-operative bleeding is normal.  If excessive bleeding occurs, remove the bandage, place gauze over the site and apply continuous pressure (no peeking) over the area for 30 minutes. If this does not work, please call our clinic as soon as possible or page your doctor if it is after hours.   Once a day, cleanse the wound with soap and water. It is fine to shower. If  a thick crust develops you may use a Q-tip dipped into dilute hydrogen peroxide (mix 1:1 with water) to dissolve it.  Hydrogen peroxide can slow the healing process, so use it only as needed.    After washing, apply petroleum jelly (Vaseline) or an antibiotic ointment if your doctor prescribed one for you, followed by a bandage.    For best healing, the wound should be covered with a layer of ointment at all times. If you are not able to keep the area covered with a bandage to hold the ointment in place, this may mean re-applying the ointment several times a day.  Continue this wound care until the wound has healed and is no longer  open.   Itching and mild discomfort is normal during the healing process. However, if you develop pain or severe itching, please call our office.   If you have any discomfort, you can take Tylenol  (acetaminophen ) or ibuprofen as directed on the bottle. (Please do not take these if you have an allergy to them or cannot take them for another reason).  Some redness, tenderness and white or yellow material in the wound is normal healing.  If the area becomes very sore and red, or develops a thick yellow-green material (pus), it may be infected; please notify us .    If you have stitches, return to clinic as directed to have the stitches removed. You will continue wound care for 2-3 days after the stitches are removed.   Wound healing continues for up to one year following surgery. It is not unusual to experience pain in the scar from time to time during the interval.  If the pain becomes severe or the scar thickens, you should notify the office.    A slight amount of redness in a scar is expected for the first six months.  After six months, the redness will fade and the scar will soften and fade.  The color difference becomes less noticeable with time.  If there are any problems, return for a post-op surgery check at your earliest convenience.  To improve the appearance of the scar, you can use silicone scar gel, cream, or sheets (such as Mederma or Serica) every night for up to one year. These are available over the counter (without a prescription).  Please call our office at [phone] for any questions or concerns.      Seborrheic Keratosis  What causes seborrheic keratoses? Seborrheic keratoses are harmless, common skin growths that first appear during adult life.  As time goes by, more growths appear.  Some people may develop a large number of them.  Seborrheic keratoses appear on both covered and uncovered body parts.  They are not caused by sunlight.  The tendency to develop seborrheic  keratoses can be inherited.  They vary in color from skin-colored to gray, brown, or even black.  They can be either smooth or have a rough, warty surface.   Seborrheic keratoses are superficial and look as if they were stuck on the skin.  Under the microscope this type of keratosis looks like layers upon layers of skin.  That is why at times the top layer may seem to fall off, but the rest of the growth remains and re-grows.    Treatment Seborrheic keratoses do not need to be treated, but can easily be removed in the office.  Seborrheic keratoses often cause symptoms when they rub on clothing or jewelry.  Lesions can be in the way of shaving.  If  they become inflamed, they can cause itching, soreness, or burning.  Removal of a seborrheic keratosis can be accomplished by freezing, burning, or surgery. If any spot bleeds, scabs, or grows rapidly, please return to have it checked, as these can be an indication of a skin cancer.   Cryotherapy Aftercare  Wash gently with soap and water everyday.   Apply Vaseline and Band-Aid daily until healed.      Due to recent changes in healthcare laws, you may see results of your pathology and/or laboratory studies on MyChart before the doctors have had a chance to review them. We understand that in some cases there may be results that are confusing or concerning to you. Please understand that not all results are received at the same time and often the doctors may need to interpret multiple results in order to provide you with the best plan of care or course of treatment. Therefore, we ask that you please give us  2 business days to thoroughly review all your results before contacting the office for clarification. Should we see a critical lab result, you will be contacted sooner.   If You Need Anything After Your Visit  If you have any questions or concerns for your doctor, please call our main line at [phone] and press option 4 to reach your doctor's  medical assistant. If no one answers, please leave a voicemail as directed and we will return your call as soon as possible. Messages left after 4 pm will be answered the following business day.   You may also send us  a message via MyChart. We typically respond to MyChart messages within 1-2 business days.  For prescription refills, please ask your pharmacy to contact our office. Our fax number is [phone].  If you have an urgent issue when the clinic is closed that cannot wait until the next business day, you can page your doctor at the number below.    Please note that while we do our best to be available for urgent issues outside of office hours, we are not available 24/7.   If you have an urgent issue and are unable to reach us , you may choose to seek medical care at your doctor's office, retail clinic, urgent care center, or emergency room.  If you have a medical emergency, please immediately call 911 or go to the emergency department.  Pager Numbers  - Dr. Hester: [phone]  - Dr. Jackquline: [phone]  - Dr. Claudene: [phone]   - Dr. Raymund: [phone]  In the event of inclement weather, please call our main line at [phone] for an update on the status of any delays or closures.  Dermatology Medication Tips: Please keep the boxes that topical medications come in in order to help keep track of the instructions about where and how to use these. Pharmacies typically print the medication instructions only on the boxes and not directly on the medication tubes.   If your medication is too expensive, please contact our office at [phone] option 4 or send us  a message through MyChart.   We are unable to tell what your co-pay for medications will be in advance as this is different depending on your insurance coverage. However, we may be able to find a substitute medication at lower cost or fill out paperwork to get insurance to cover a needed medication.   If a  prior authorization is required to get your medication covered by your insurance company, please allow us  1-2 business days to complete this  process.  Drug prices often vary depending on where the prescription is filled and some pharmacies may offer cheaper prices.  The website www.goodrx.com contains coupons for medications through different pharmacies. The prices here do not account for what the cost may be with help from insurance (it may be cheaper with your insurance), but the website can give you the price if you did not use any insurance.  - You can print the associated coupon and take it with your prescription to the pharmacy.  - You may also stop by our office during regular business hours and pick up a GoodRx coupon card.  - If you need your prescription sent electronically to a different pharmacy, notify our office through [facility] or by phone at [phone] option 4.     Si Usted Necesita Algo Despus de Su Visita  Tambin puede enviarnos un mensaje a travs de Clinical cytogeneticist. Por lo general respondemos a los mensajes de MyChart en el transcurso de 1 a 2 das hbiles.  Para renovar recetas, por favor pida a su farmacia que se ponga en contacto con nuestra oficina. Randi lakes de fax es [place] [phone].  Si tiene un asunto urgente cuando la clnica est cerrada y que no puede esperar hasta el siguiente da hbil, puede llamar/localizar a su doctor(a) al nmero que aparece a continuacin.   Por favor, tenga en cuenta que aunque hacemos todo lo posible para estar disponibles para asuntos urgentes fuera del horario de [place], no estamos disponibles las 24 horas del da, los 7 [address]  [address] de la [place].   Si tiene un problema urgente y no puede comunicarse con nosotros, puede optar por buscar atencin mdica  en el consultorio de su doctor(a), en una clnica privada, en un centro de atencin urgente o en una sala de emergencias.  Si tiene Engineer, drilling, por favor llame  inmediatamente al 911 o vaya a la sala de emergencias.  Nmeros de bper  - Dr. Hester: [phone]  - Dra. Jackquline: [phone]  - Dr. Claudene: [phone]  - Dra. Kitts: [phone]  En caso de inclemencias del [place], por favor llame a nuestra lnea principal al [phone] para una actualizacin sobre el estado de cualquier retraso o cierre.  Consejos para la medicacin en dermatologa: Por favor, guarde las cajas en las que vienen los medicamentos de uso tpico para ayudarle a seguir las instrucciones sobre dnde y cmo usarlos. Las farmacias generalmente imprimen las instrucciones del medicamento slo en las cajas y no directamente en los tubos del [place].   Si su medicamento es muy caro, por favor, pngase en contacto con landry rieger llamando al [phone] y presione la opcin 4 o envenos un mensaje a travs de Clinical cytogeneticist.   No podemos decirle cul ser su copago por los medicamentos por adelantado ya que esto es diferente dependiendo de la cobertura de su seguro. Sin embargo, es posible que podamos encontrar un medicamento sustituto a Audiological scientist un formulario para que el seguro cubra el medicamento que se considera necesario.   Si se requiere una autorizacin previa para que su compaa de seguros malta su medicamento, por favor permtanos de 1 a 2 das hbiles para completar este proceso.  Los precios de los medicamentos varan con frecuencia dependiendo del Environmental consultant de dnde se surte la receta y alguna farmacias pueden ofrecer precios ms baratos.  El sitio web www.goodrx.com tiene cupones para medicamentos de Health and safety inspector. Los precios aqu no tienen en cuenta lo que podra costar con la  ayuda del seguro (puede ser ms barato con su seguro), pero el sitio web puede darle el precio si no Visual merchandiser.  - Puede imprimir el cupn correspondiente y llevarlo con su receta a la farmacia.  - Tambin puede pasar por nuestra oficina durante el horario  de atencin regular y Education officer, museum una tarjeta de cupones de GoodRx.  - Si necesita que su receta se enve electrnicamente a una farmacia diferente, informe a nuestra oficina a travs de MyChart de [place] o por telfono llamando al [phone] y presione la opcin 4.

## 2024-03-28 LAB — SURGICAL PATHOLOGY

## 2024-03-31 ENCOUNTER — Encounter: Payer: Self-pay | Admitting: Dermatology

## 2024-03-31 ENCOUNTER — Ambulatory Visit: Payer: Self-pay | Admitting: Dermatology

## 2024-03-31 NOTE — Telephone Encounter (Addendum)
 Called and discussed bx results with patient. She verbalized understanding. Will recheck at next patient followup.  ----- Message from Alm Rhyme sent at 03/31/2024  5:08 PM EDT ----- FINAL DIAGNOSIS        1. Skin, right forehead :       BASAL CELL CARCINOMA, NODULAR AND INFILTRATIVE PATTERNS    Cancer = BCC Already treated Recheck next visit ----- Message ----- From: Interface, Lab In Three Zero One Sent: 03/28/2024   4:11 PM EDT To: Alm JAYSON Rhyme, MD

## 2024-04-23 ENCOUNTER — Other Ambulatory Visit: Payer: Self-pay | Admitting: Registered Nurse

## 2024-04-23 DIAGNOSIS — Z1231 Encounter for screening mammogram for malignant neoplasm of breast: Secondary | ICD-10-CM

## 2024-05-14 ENCOUNTER — Ambulatory Visit

## 2024-06-06 ENCOUNTER — Inpatient Hospital Stay: Admission: RE | Admit: 2024-06-06 | Discharge: 2024-06-06 | Attending: Registered Nurse | Admitting: Registered Nurse

## 2024-06-06 DIAGNOSIS — Z1231 Encounter for screening mammogram for malignant neoplasm of breast: Secondary | ICD-10-CM

## 2024-06-10 LAB — LAB REPORT - SCANNED
A1c: 6
EGFR: 72

## 2024-09-24 ENCOUNTER — Ambulatory Visit: Admitting: Dermatology

## 2024-10-06 ENCOUNTER — Ambulatory Visit: Admitting: Dermatology
